# Patient Record
Sex: Male | Born: 1940 | Race: White | Hispanic: No | Marital: Married | Smoking: Never smoker
Health system: Southern US, Community
[De-identification: ages and names within clinical notes are randomized; demographics above are authoritative.]

## PROBLEM LIST (undated history)

## (undated) DIAGNOSIS — E785 Hyperlipidemia, unspecified: Secondary | ICD-10-CM

## (undated) DIAGNOSIS — R011 Cardiac murmur, unspecified: Secondary | ICD-10-CM

## (undated) DIAGNOSIS — K219 Gastro-esophageal reflux disease without esophagitis: Secondary | ICD-10-CM

## (undated) DIAGNOSIS — R768 Other specified abnormal immunological findings in serum: Secondary | ICD-10-CM

## (undated) DIAGNOSIS — I10 Essential (primary) hypertension: Secondary | ICD-10-CM

## (undated) HISTORY — PX: WISDOM TOOTH EXTRACTION: SHX21

## (undated) HISTORY — DX: Hyperlipidemia, unspecified: E78.5

## (undated) HISTORY — DX: Essential (primary) hypertension: I10

---

## 1965-04-24 HISTORY — PX: APPENDECTOMY: SHX54

## 1999-03-25 HISTORY — PX: CORONARY ARTERY BYPASS GRAFT: SHX141

## 2012-08-11 ENCOUNTER — Emergency Department: Payer: Self-pay | Admitting: Emergency Medicine

## 2012-08-11 LAB — URINALYSIS, COMPLETE
Bilirubin,UR: NEGATIVE
Glucose,UR: NEGATIVE mg/dL (ref 0–75)
Nitrite: NEGATIVE
Ph: 5 (ref 4.5–8.0)
Protein: NEGATIVE
RBC,UR: 7 /HPF (ref 0–5)
Specific Gravity: 1.01 (ref 1.003–1.030)
WBC UR: 7 /HPF (ref 0–5)

## 2012-08-11 LAB — COMPREHENSIVE METABOLIC PANEL
Albumin: 3.4 g/dL (ref 3.4–5.0)
Alkaline Phosphatase: 81 U/L (ref 50–136)
BUN: 27 mg/dL — ABNORMAL HIGH (ref 7–18)
Calcium, Total: 8.9 mg/dL (ref 8.5–10.1)
Chloride: 104 mmol/L (ref 98–107)
Creatinine: 1.41 mg/dL — ABNORMAL HIGH (ref 0.60–1.30)
EGFR (African American): 58 — ABNORMAL LOW
Glucose: 89 mg/dL (ref 65–99)
Osmolality: 282 (ref 275–301)
SGOT(AST): 26 U/L (ref 15–37)
Sodium: 139 mmol/L (ref 136–145)
Total Protein: 8.5 g/dL — ABNORMAL HIGH (ref 6.4–8.2)

## 2012-08-11 LAB — CBC
HCT: 36.7 % — ABNORMAL LOW (ref 40.0–52.0)
MCH: 29 pg (ref 26.0–34.0)
MCHC: 33 g/dL (ref 32.0–36.0)
Platelet: 320 10*3/uL (ref 150–440)
RBC: 4.18 10*6/uL — ABNORMAL LOW (ref 4.40–5.90)

## 2012-08-16 ENCOUNTER — Ambulatory Visit: Payer: Self-pay | Admitting: Urology

## 2012-08-21 ENCOUNTER — Ambulatory Visit: Payer: Self-pay

## 2012-08-21 LAB — CBC WITH DIFFERENTIAL/PLATELET
Eosinophil #: 0.2 10*3/uL (ref 0.0–0.7)
Eosinophil %: 1.8 %
HCT: 31.5 % — ABNORMAL LOW (ref 40.0–52.0)
HGB: 10 g/dL — ABNORMAL LOW (ref 13.0–18.0)
Lymphocyte #: 1.5 10*3/uL (ref 1.0–3.6)
MCH: 27.3 pg (ref 26.0–34.0)
MCHC: 31.8 g/dL — ABNORMAL LOW (ref 32.0–36.0)
MCV: 86 fL (ref 80–100)
Neutrophil #: 9.4 10*3/uL — ABNORMAL HIGH (ref 1.4–6.5)
Platelet: 429 10*3/uL (ref 150–440)
WBC: 12.1 10*3/uL — ABNORMAL HIGH (ref 3.8–10.6)

## 2012-08-21 LAB — COMPREHENSIVE METABOLIC PANEL
Albumin: 2.8 g/dL — ABNORMAL LOW (ref 3.4–5.0)
Alkaline Phosphatase: 280 U/L — ABNORMAL HIGH (ref 50–136)
Anion Gap: 8 (ref 7–16)
BUN: 59 mg/dL — ABNORMAL HIGH (ref 7–18)
Bilirubin,Total: 0.6 mg/dL (ref 0.2–1.0)
Calcium, Total: 9 mg/dL (ref 8.5–10.1)
Chloride: 102 mmol/L (ref 98–107)
Creatinine: 1.36 mg/dL — ABNORMAL HIGH (ref 0.60–1.30)
EGFR (Non-African Amer.): 52 — ABNORMAL LOW
Glucose: 100 mg/dL — ABNORMAL HIGH (ref 65–99)
Sodium: 142 mmol/L (ref 136–145)

## 2012-08-21 LAB — LIPID PANEL
Ldl Cholesterol, Calc: 43 mg/dL (ref 0–100)
VLDL Cholesterol, Calc: 19 mg/dL (ref 5–40)

## 2017-04-09 ENCOUNTER — Encounter: Payer: Self-pay | Admitting: Family Medicine

## 2017-04-09 ENCOUNTER — Ambulatory Visit: Payer: Self-pay | Admitting: Family Medicine

## 2017-04-09 ENCOUNTER — Ambulatory Visit (INDEPENDENT_AMBULATORY_CARE_PROVIDER_SITE_OTHER): Payer: Medicare Other | Admitting: Family Medicine

## 2017-04-09 ENCOUNTER — Other Ambulatory Visit
Admission: RE | Admit: 2017-04-09 | Discharge: 2017-04-09 | Disposition: A | Discharge status: Home / Self Care | Payer: Medicare Other | Source: Ambulatory Visit | Attending: Family Medicine | Admitting: Family Medicine

## 2017-04-09 VITALS — BP 128/62 | HR 70 | Ht 67.0 in | Wt 127.0 lb

## 2017-04-09 DIAGNOSIS — Z87442 Personal history of urinary calculi: Secondary | ICD-10-CM

## 2017-04-09 DIAGNOSIS — Z951 Presence of aortocoronary bypass graft: Secondary | ICD-10-CM | POA: Diagnosis not present

## 2017-04-09 DIAGNOSIS — I10 Essential (primary) hypertension: Secondary | ICD-10-CM | POA: Diagnosis present

## 2017-04-09 DIAGNOSIS — I2510 Atherosclerotic heart disease of native coronary artery without angina pectoris: Secondary | ICD-10-CM | POA: Diagnosis present

## 2017-04-09 DIAGNOSIS — D649 Anemia, unspecified: Secondary | ICD-10-CM | POA: Diagnosis not present

## 2017-04-09 DIAGNOSIS — Z23 Encounter for immunization: Secondary | ICD-10-CM

## 2017-04-09 DIAGNOSIS — R972 Elevated prostate specific antigen [PSA]: Secondary | ICD-10-CM | POA: Insufficient documentation

## 2017-04-09 DIAGNOSIS — Z8619 Personal history of other infectious and parasitic diseases: Secondary | ICD-10-CM | POA: Insufficient documentation

## 2017-04-09 DIAGNOSIS — E785 Hyperlipidemia, unspecified: Secondary | ICD-10-CM | POA: Diagnosis not present

## 2017-04-09 LAB — COMPREHENSIVE METABOLIC PANEL
ALBUMIN: 4.1 g/dL (ref 3.5–5.0)
ALK PHOS: 73 U/L (ref 38–126)
ALT: 19 U/L (ref 17–63)
AST: 28 U/L (ref 15–41)
Anion gap: 8 (ref 5–15)
BILIRUBIN TOTAL: 1.5 mg/dL — AB (ref 0.3–1.2)
BUN: 30 mg/dL — AB (ref 6–20)
CALCIUM: 8.8 mg/dL — AB (ref 8.9–10.3)
CO2: 30 mmol/L (ref 22–32)
CREATININE: 1.06 mg/dL (ref 0.61–1.24)
Chloride: 104 mmol/L (ref 101–111)
GFR calc Af Amer: >60 mL/min (ref >60)
GFR calc non Af Amer: >60 mL/min (ref >60)
GLUCOSE: 92 mg/dL (ref 65–99)
Potassium: 4.3 mmol/L (ref 3.5–5.1)
Sodium: 142 mmol/L (ref 135–145)
TOTAL PROTEIN: 7.9 g/dL (ref 6.5–8.1)

## 2017-04-09 LAB — CBC
HCT: 37.5 % — ABNORMAL LOW (ref 40.0–52.0)
Hemoglobin: 12.4 g/dL — ABNORMAL LOW (ref 13.0–18.0)
MCH: 29.3 pg (ref 26.0–34.0)
MCHC: 33.1 g/dL (ref 32.0–36.0)
MCV: 88.8 fL (ref 80.0–100.0)
PLATELETS: 217 10*3/uL (ref 150–440)
RBC: 4.22 MIL/uL — AB (ref 4.40–5.90)
RDW: 14.8 % — ABNORMAL HIGH (ref 11.5–14.5)
WBC: 8.1 10*3/uL (ref 3.8–10.6)

## 2017-04-09 LAB — LIPID PANEL
CHOL/HDL RATIO: 2.1 ratio
CHOLESTEROL: 121 mg/dL (ref 0–200)
HDL: 57 mg/dL (ref >40)
LDL Cholesterol: 55 mg/dL (ref 0–99)
Triglycerides: 45 mg/dL (ref <150)
VLDL: 9 mg/dL (ref 0–40)

## 2017-04-09 LAB — TSH: TSH: 1.407 u[IU]/mL (ref 0.350–4.500)

## 2017-04-09 MED ORDER — ZOSTER VAC RECOMB ADJUVANTED 50 MCG/0.5ML IM SUSR: 0.5000 mL | Freq: Once | INTRAMUSCULAR | 1 refills | Status: AC

## 2017-04-10 LAB — HCV RNA QUANT: HCV QUANT: NOT DETECTED [IU]/mL (ref >50)

## 2017-04-11 DIAGNOSIS — Z951 Presence of aortocoronary bypass graft: Secondary | ICD-10-CM | POA: Insufficient documentation

## 2017-04-11 NOTE — Progress Notes (Signed)
Date:  04/09/2017   Name:  Seth Woodard   DOB:  May 13, 1940   MRN:  [id number]  PCP:  Adline Potter, MD    Chief Complaint: Establish Care (Requesting EKG and CBC with PSA)   History of Present Illness:  This is a 76 y.o. male seen for initial visit. CAD s/p CABG x 4 in 2002, no further cardiac issues, has not seen cards in years. HTN on Ziac, HLD on Lipitor x yrs, not taking asa. Kidney stones 3-4 yrs ago, none since. Sl anemia on last blood work. PSA elevated to 8 in past, 4.1 when last checked in 2017. Told had positive hep C ab when tried to give blood. Plans to see optho soon for cataract. Father died hep C/heart dz 40, mother deid 70 Parkinsonism. Tdap 6 months ago, no pneumo or zoster imms, declines flu imm, colonoscopy 6-7 yrs ago normal  Review of Systems:  Review of Systems  Constitutional: Negative for chills and fever.  HENT: Negative for ear pain, sinus pain and trouble swallowing.   Eyes: Negative for pain.  Respiratory: Negative for cough and shortness of breath.   Cardiovascular: Negative for chest pain and leg swelling.  Gastrointestinal: Negative for abdominal pain.  Genitourinary: Negative for difficulty urinating, frequency and hematuria.  Neurological: Negative for syncope and light-headedness.    Patient Active Problem List   Diagnosis Date Noted  . History of four vessel coronary artery bypass graft 04/11/2017  . CAD (coronary artery disease) 04/09/2017  . Hypertension 04/09/2017  . Elevated PSA 04/09/2017  . 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 Hyperlipidemia 04/09/2017  . History of kidney stones 04/09/2017  . Anemia, normocytic normochromic 04/09/2017    Prior to Admission medications   Medication Sig Start Date End Date Taking? Authorizing Provider  aspirin EC 81 MG tablet Take 81 mg by mouth daily.   Yes [provider]  atorvastatin (LIPITOR) 80 MG tablet Take 80 mg by mouth daily.   Yes [provider]   bisoprolol-hydrochlorothiazide (ZIAC) 2.5-6.25 MG tablet Take 1 tablet by mouth daily.   Yes [provider]    No Known Allergies  Past Surgical History:  Procedure Laterality Date  . CORONARY ARTERY BYPASS GRAFT  03/1999   4 times    Social History   Tobacco Use  . Smoking status: Never Smoker  . Smokeless tobacco: Never Used  Substance Use Topics  . Alcohol use: No    Frequency: Never  . Drug use: No    History reviewed. No pertinent family history.  Medication list has been reviewed and updated.  Physical Examination: BP 128/62   Pulse 70   Ht 5\' 7"  (1.702 m)   Wt 127 lb (57.6 kg)   SpO2 98%   BMI 19.89 kg/m   Physical Exam  Constitutional: He is oriented to person, place, and time. He appears well-developed and well-nourished.  HENT:  Head: Normocephalic and atraumatic.  Right Ear: External ear normal.  Left Ear: External ear normal.  Nose: Nose normal.  Mouth/Throat: Oropharynx is clear and moist.  TMs clear  Eyes: Conjunctivae and EOM are normal. Pupils are equal, round, and reactive to light.  Neck: Neck supple. No thyromegaly present.  Cardiovascular: Normal rate, regular rhythm and normal heart sounds.  Pulmonary/Chest: Effort normal and breath sounds normal.  Abdominal: Soft. He exhibits no distension and no mass. There is no tenderness.  Musculoskeletal: He exhibits no edema.  Lymphadenopathy:    He has no cervical  adenopathy.  Neurological: He is alert and oriented to person, place, and time. Coordination normal.  Romberg neg, gait normal  Skin: Skin is warm and dry.  Psychiatric: He has a normal mood and affect. His behavior is normal.  Nursing note and vitals reviewed.   Assessment and Plan:  1. Coronary artery disease involving native coronary artery of native heart without angina pectoris Stable on BB/statin, add daily asa, likely needs stress test - Ambulatory referral to Cardiology - Comprehensive Metabolic Panel (CMET) -  CBC - Lipid Profile  2. Essential hypertension Well controlled on Ziac - TSH  3. Hyperlipidemia, unspecified hyperlipidemia type Well controlled on Lipitor  4. Anemia, normocytic normochromic Unclear etiology, recheck  5. Elevated PSA Consider repeat next visit  6. History of positive hepatitis C - HCV RNA quant  7. History of kidney stones No recurrence  8. History of four vessel coronary artery bypass graft  9. Need for zoster vaccination - Zoster Vaccine Adjuvanted [facility]) injection; Inject 0.5 mLs into the muscle once for 1 dose.  Dispense: 0.5 mL; Refill: 1  10. Need for pneumococcal vaccination - Pneumococcal conjugate vaccine 13-valent  Return in about 4 weeks (around 05/07/2017).   60 minutes spent with patient over half in [place]. [facility]  04/11/2017

## 2017-04-18 ENCOUNTER — Other Ambulatory Visit: Payer: Self-pay | Admitting: Family Medicine

## 2017-04-18 MED ORDER — ATORVASTATIN CALCIUM 40 MG PO TABS: 40.0000 mg | ORAL_TABLET | Freq: Every day | ORAL | 3 refills | Status: DC

## 2017-04-18 MED ORDER — BISOPROLOL-HYDROCHLOROTHIAZIDE 2.5-6.25 MG PO TABS: 1.0000 | ORAL_TABLET | Freq: Every day | ORAL | 3 refills | Status: DC

## 2017-04-18 MED ORDER — ATORVASTATIN CALCIUM 40 MG PO TABS: 20.0000 mg | ORAL_TABLET | Freq: Every day | ORAL | 3 refills | Status: DC

## 2017-04-21 ENCOUNTER — Ambulatory Visit
Admission: EM | Admit: 2017-04-21 | Discharge: 2017-04-21 | Disposition: A | Discharge status: Home / Self Care | Payer: Medicare Other | Attending: Family Medicine | Admitting: Family Medicine

## 2017-04-21 ENCOUNTER — Encounter: Payer: Self-pay | Admitting: *Deleted

## 2017-04-21 ENCOUNTER — Other Ambulatory Visit: Payer: Self-pay

## 2017-04-21 DIAGNOSIS — I10 Essential (primary) hypertension: Secondary | ICD-10-CM | POA: Insufficient documentation

## 2017-04-21 DIAGNOSIS — D649 Anemia, unspecified: Secondary | ICD-10-CM | POA: Insufficient documentation

## 2017-04-21 DIAGNOSIS — Z7982 Long term (current) use of aspirin: Secondary | ICD-10-CM | POA: Diagnosis not present

## 2017-04-21 DIAGNOSIS — B1920 Unspecified viral hepatitis C without hepatic coma: Secondary | ICD-10-CM | POA: Insufficient documentation

## 2017-04-21 DIAGNOSIS — Z79899 Other long term (current) drug therapy: Secondary | ICD-10-CM | POA: Insufficient documentation

## 2017-04-21 DIAGNOSIS — Z951 Presence of aortocoronary bypass graft: Secondary | ICD-10-CM | POA: Insufficient documentation

## 2017-04-21 DIAGNOSIS — E785 Hyperlipidemia, unspecified: Secondary | ICD-10-CM | POA: Diagnosis not present

## 2017-04-21 DIAGNOSIS — I2510 Atherosclerotic heart disease of native coronary artery without angina pectoris: Secondary | ICD-10-CM | POA: Diagnosis not present

## 2017-04-21 DIAGNOSIS — Z87442 Personal history of urinary calculi: Secondary | ICD-10-CM | POA: Insufficient documentation

## 2017-04-21 DIAGNOSIS — R319 Hematuria, unspecified: Secondary | ICD-10-CM | POA: Diagnosis not present

## 2017-04-21 LAB — URINALYSIS, COMPLETE (UACMP) WITH MICROSCOPIC: SQUAMOUS EPITHELIAL / LPF: NONE SEEN

## 2017-04-21 MED ORDER — SULFAMETHOXAZOLE-TRIMETHOPRIM 800-160 MG PO TABS: 1.0000 | ORAL_TABLET | Freq: Two times a day (BID) | ORAL | 0 refills | Status: DC

## 2017-04-21 NOTE — ED Provider Notes (Signed)
[facility]    CSN: [id number] Arrival date & time: 04/21/17  1125     History   Chief Complaint Chief Complaint  Patient presents with  . Hematuria    HPI Seth Woodard is a 76 y.o. male.   76 yo male with a c/o blood in the urine since yesterday. Denies any other symptoms. Denies pain, fevers, chills, vomiting, injuries, discharge.    The history is provided by the patient.    Past Medical History:  Diagnosis Date  . Hyperlipidemia   . Hypertension     Patient Active Problem List   Diagnosis Date Noted  . History of four vessel coronary artery bypass graft 04/11/2017  . CAD (coronary artery disease) 04/09/2017  . Hypertension 04/09/2017  . Elevated PSA 04/09/2017  . 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 Hyperlipidemia 04/09/2017  . History of kidney stones 04/09/2017  . Anemia, normocytic normochromic 04/09/2017    Past Surgical History:  Procedure Laterality Date  . CORONARY ARTERY BYPASS GRAFT  03/1999   4 times       Home Medications    Prior to Admission medications   Medication Sig Start Date End Date Taking? Authorizing Provider  atorvastatin (LIPITOR) 40 MG tablet Take 0.5 tablets (20 mg total) by mouth daily. 04/18/17  Yes Plonk, Gwyndolyn Saxon, MD  bisoprolol-hydrochlorothiazide [facility]) 2.5-6.25 MG tablet Take 1 tablet by mouth daily. 04/18/17  Yes Plonk, Gwyndolyn Saxon, MD  aspirin EC 81 MG tablet Take 81 mg by mouth daily.    [provider]  sulfamethoxazole-trimethoprim (BACTRIM DS,SEPTRA DS) 800-160 MG tablet Take 1 tablet by mouth 2 (two) times daily. 04/21/17   Norval Gable, MD    Family History History reviewed. No pertinent family history.  Social History Social History   Tobacco Use  . Smoking status: Never Smoker  . Smokeless tobacco: Never Used  Substance Use Topics  . Alcohol use: No    Frequency: Never  . Drug use: No     Allergies   Patient has no known allergies.   Review of Systems Review  of Systems   Physical Exam Triage Vital Signs ED Triage Vitals  Enc Vitals Group     BP 04/21/17 1403 (!) 152/66     Pulse Rate 04/21/17 1403 69     Resp 04/21/17 1403 16     Temp 04/21/17 1403 98.3 F (36.8 C)     Temp Source 04/21/17 1403 Oral     SpO2 04/21/17 1403 99 %     Weight --      Height --      Head Circumference --      Peak Flow --      Pain Score 04/21/17 1406 0     Pain Loc --      Pain Edu? --      Excl. in [place]? --    No data found.  Updated Vital Signs BP (!) 152/66 (BP Location: Left Arm)   Pulse 69   Temp 98.3 F (36.8 C) (Oral)   Resp 16   SpO2 99%   Visual Acuity Right Eye Distance:   Left Eye Distance:   Bilateral Distance:    Right Eye Near:   Left Eye Near:    Bilateral Near:     Physical Exam  Constitutional: He is oriented to person, place, and time. He appears well-developed and well-nourished. No distress.  Cardiovascular: Normal rate.  Pulmonary/Chest: Effort normal. No respiratory distress.  Abdominal: Soft. Bowel  sounds are normal. He exhibits no distension and no mass. There is no tenderness. There is no rebound and no guarding.  Neurological: He is alert and oriented to person, place, and time.  Skin: No rash noted. He is not diaphoretic.  Nursing note and vitals reviewed.    UC Treatments / Results  Labs (all labs ordered are listed, but only abnormal results are displayed) Labs Reviewed  URINALYSIS, COMPLETE (UACMP) WITH MICROSCOPIC - Abnormal; Notable for the following components:      Result Value   Color, Urine BROWN (*)    APPearance CLOUDY (*)    Glucose, UA   (*)    Value: TEST NOT REPORTED DUE TO COLOR INTERFERENCE OF URINE PIGMENT   Hgb urine dipstick   (*)    Value: TEST NOT REPORTED DUE TO COLOR INTERFERENCE OF URINE PIGMENT   Bilirubin Urine   (*)    Value: TEST NOT REPORTED DUE TO COLOR INTERFERENCE OF URINE PIGMENT   Ketones, ur   (*)    Value: TEST NOT REPORTED DUE TO COLOR INTERFERENCE OF URINE  PIGMENT   Protein, ur   (*)    Value: TEST NOT REPORTED DUE TO COLOR INTERFERENCE OF URINE PIGMENT   Nitrite   (*)    Value: TEST NOT REPORTED DUE TO COLOR INTERFERENCE OF URINE PIGMENT   Leukocytes, UA   (*)    Value: TEST NOT REPORTED DUE TO COLOR INTERFERENCE OF URINE PIGMENT   Bacteria, UA FEW (*)    All other components within normal limits  URINE CULTURE    EKG  EKG Interpretation None       Radiology No results found.  Procedures Procedures (including critical care time)  Medications Ordered in UC Medications - No data to display   Initial Impression / Assessment and Plan / UC Course  I have reviewed the triage vital signs and the nursing notes.  Pertinent labs & imaging results that were available during my care of the patient were reviewed by me and considered in my medical decision making (see chart for details).       Final Clinical Impressions(s) / UC Diagnoses   Final diagnoses:  Hematuria, unspecified type  (likely secondary to UTI)  ED Discharge Orders        Ordered    sulfamethoxazole-trimethoprim (BACTRIM DS,SEPTRA DS) 800-160 MG tablet  2 times daily     04/21/17 1441     1. Lab results (UA) and diagnosis reviewed with patient 2. rx as per orders above; reviewed possible side effects, interactions, risks and benefits  3. Recommend supportive treatment with increased fluids 4. Check urine culture 5. Follow-up prn if symptoms worsen or don't improve Controlled Substance Prescriptions [place] Controlled Substance Registry consulted? Not Applicable   Norval Gable, MD 04/22/17 1806

## 2017-04-21 NOTE — ED Triage Notes (Signed)
Patient started having symptom of blood in urine yesterday. Patient does have a history of prostate issues.

## 2017-04-23 LAB — URINE CULTURE: Special Requests: NORMAL

## 2017-05-07 ENCOUNTER — Encounter: Payer: Self-pay | Admitting: Family Medicine

## 2017-05-07 ENCOUNTER — Ambulatory Visit (INDEPENDENT_AMBULATORY_CARE_PROVIDER_SITE_OTHER): Payer: Medicare Other | Admitting: Family Medicine

## 2017-05-07 VITALS — BP 120/62 | HR 64 | Ht 67.0 in | Wt 128.0 lb

## 2017-05-07 DIAGNOSIS — R3121 Asymptomatic microscopic hematuria: Secondary | ICD-10-CM | POA: Diagnosis not present

## 2017-05-07 DIAGNOSIS — I2510 Atherosclerotic heart disease of native coronary artery without angina pectoris: Secondary | ICD-10-CM

## 2017-05-07 DIAGNOSIS — R972 Elevated prostate specific antigen [PSA]: Secondary | ICD-10-CM

## 2017-05-07 DIAGNOSIS — I10 Essential (primary) hypertension: Secondary | ICD-10-CM | POA: Diagnosis not present

## 2017-05-07 LAB — POCT URINALYSIS DIPSTICK
Bilirubin, UA: NEGATIVE
GLUCOSE UA: NEGATIVE
Ketones, UA: NEGATIVE
Nitrite, UA: NEGATIVE
SPEC GRAV UA: 1.025 (ref 1.010–1.025)
Urobilinogen, UA: 0.2 E.U./dL
pH, UA: 6 (ref 5.0–8.0)

## 2017-05-08 ENCOUNTER — Other Ambulatory Visit: Payer: Self-pay

## 2017-05-08 ENCOUNTER — Other Ambulatory Visit: Payer: Self-pay | Admitting: Family Medicine

## 2017-05-08 DIAGNOSIS — R3121 Asymptomatic microscopic hematuria: Secondary | ICD-10-CM

## 2017-05-08 LAB — PSA: Prostate Specific Ag, Serum: 4.6 ng/mL — ABNORMAL HIGH (ref 0.0–4.0)

## 2017-05-08 LAB — VITAMIN D 25 HYDROXY (VIT D DEFICIENCY, FRACTURES): VIT D 25 HYDROXY: 34.3 ng/mL (ref 30.0–100.0)

## 2017-05-08 NOTE — Progress Notes (Signed)
Date:  05/07/2017   Name:  Seth Woodard   DOB:  05-Dec-1940   MRN:  [id number]  PCP:  Adline Potter, MD    Chief Complaint: Follow-up (possible vitamin d level drawn? "Not sure why I'm here")   History of Present Illness:  This is a 77 y.o. male seen for one month f/u. Saw cards, scheduled for stress echo the 28th. Dx'd UTI 12/29 at [facility] [place] resolved with abx but urine cx equivocal. No current gross hematuria. Blood work last visit showed sl anemia which is chronic and hypocalcemia, on vit D and calcium supplement.  Review of Systems:  Review of Systems  Constitutional: Negative for chills and fever.  Respiratory: Negative for cough and shortness of breath.   Cardiovascular: Negative for chest pain and leg swelling.  Genitourinary: Negative for difficulty urinating and dysuria.  Neurological: Negative for syncope and light-headedness.    Patient Active Problem List   Diagnosis Date Noted  . History of four vessel coronary artery bypass graft 04/11/2017  . CAD (coronary artery disease) 04/09/2017  . Hypertension 04/09/2017  . Elevated PSA 04/09/2017  . 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 Hyperlipidemia 04/09/2017  . History of kidney stones 04/09/2017  . Anemia, normocytic normochromic 04/09/2017    Prior to Admission medications   Medication Sig Start Date End Date Taking? Authorizing Provider  atorvastatin (LIPITOR) 40 MG tablet Take 0.5 tablets (20 mg total) by mouth daily. 04/18/17  Yes Zinnia Tindall, Gwyndolyn Saxon, MD  bisoprolol-hydrochlorothiazide [facility]) 2.5-6.25 MG tablet Take 1 tablet by mouth daily. 04/18/17  Yes Alireza Pollack, Gwyndolyn Saxon, MD  Calcium Carbonate-Vitamin D (RA CALCIUM PLUS VITAMIN D) 600-400 MG-UNIT tablet Take 1 tablet by mouth daily.   Yes [provider]  vitamin C (ASCORBIC ACID) 500 MG tablet Take 500 mg by mouth daily.   Yes [provider]  aspirin EC 81 MG tablet Take 81 mg by mouth daily.    [provider]    No Known  Allergies  Past Surgical History:  Procedure Laterality Date  . CORONARY ARTERY BYPASS GRAFT  03/1999   4 times    Social History   Tobacco Use  . Smoking status: Never Smoker  . Smokeless tobacco: Never Used  Substance Use Topics  . Alcohol use: No    Frequency: Never  . Drug use: No    No family history on file.  Medication list has been reviewed and updated.  Physical Examination: BP 120/62   Pulse 64   Ht 5\' 7"  (1.702 m)   Wt 128 lb (58.1 kg)   BMI 20.05 kg/m   Physical Exam  Constitutional: He appears well-developed and well-nourished.  Cardiovascular: Normal rate, regular rhythm and normal heart sounds.  Pulmonary/Chest: Effort normal and breath sounds normal.  Musculoskeletal: He exhibits no edema.  Neurological: He is alert.  Skin: Skin is warm and dry.  Psychiatric: He has a normal mood and affect. His behavior is normal.  Nursing note and vitals reviewed.   Assessment and Plan:  1. Coronary artery disease involving native coronary artery of native heart without angina pectoris Stable on BB/asa, for stress echo end of month  2. Essential hypertension Well controlled on Ziac  3. Hypocalcemia On vit D/calcium supplement - Vitamin D (25 hydroxy)  4. Elevated PSA - PSA  5. Asymptomatic microscopic hematuria UA shows 3+ leuk, lg blood, insufficient volume for cx, will return in AM to give sample, consider urology referral - POCT Urinalysis Dipstick  Return in about  6 months (around 11/04/2017).  Satira Anis. [facility]  05/08/2017

## 2017-05-10 LAB — URINE CULTURE: ORGANISM ID, BACTERIA: NO GROWTH

## 2017-05-10 LAB — SPECIMEN STATUS REPORT

## 2017-05-14 ENCOUNTER — Other Ambulatory Visit: Payer: Self-pay | Admitting: Family Medicine

## 2017-05-14 DIAGNOSIS — R3121 Asymptomatic microscopic hematuria: Secondary | ICD-10-CM

## 2017-06-12 ENCOUNTER — Other Ambulatory Visit: Payer: Self-pay

## 2017-06-12 ENCOUNTER — Encounter: Payer: Self-pay | Admitting: *Deleted

## 2017-06-15 NOTE — Discharge Instructions (Signed)

## 2017-06-18 ENCOUNTER — Ambulatory Visit: Payer: Medicare Other | Admitting: Anesthesiology

## 2017-06-18 ENCOUNTER — Ambulatory Visit
Admission: RE | Admit: 2017-06-18 | Discharge: 2017-06-18 | Disposition: A | Discharge status: Home / Self Care | Payer: Medicare Other | Source: Ambulatory Visit | Attending: Ophthalmology | Admitting: Ophthalmology

## 2017-06-18 ENCOUNTER — Encounter
Admission: RE | Disposition: A | Discharge status: Home / Self Care | Payer: Self-pay | Source: Ambulatory Visit | Attending: Ophthalmology

## 2017-06-18 DIAGNOSIS — Z955 Presence of coronary angioplasty implant and graft: Secondary | ICD-10-CM | POA: Diagnosis not present

## 2017-06-18 DIAGNOSIS — E7800 Pure hypercholesterolemia, unspecified: Secondary | ICD-10-CM | POA: Diagnosis not present

## 2017-06-18 DIAGNOSIS — H2511 Age-related nuclear cataract, right eye: Secondary | ICD-10-CM | POA: Insufficient documentation

## 2017-06-18 DIAGNOSIS — I10 Essential (primary) hypertension: Secondary | ICD-10-CM | POA: Insufficient documentation

## 2017-06-18 DIAGNOSIS — Z951 Presence of aortocoronary bypass graft: Secondary | ICD-10-CM | POA: Insufficient documentation

## 2017-06-18 DIAGNOSIS — I2510 Atherosclerotic heart disease of native coronary artery without angina pectoris: Secondary | ICD-10-CM | POA: Insufficient documentation

## 2017-06-18 HISTORY — DX: Other specified abnormal immunological findings in serum: R76.8

## 2017-06-18 HISTORY — DX: Cardiac murmur, unspecified: R01.1

## 2017-06-18 HISTORY — PX: CATARACT EXTRACTION W/PHACO: SHX586

## 2017-06-18 SURGERY — PHACOEMULSIFICATION, CATARACT, WITH IOL INSERTION
Anesthesia: Monitor Anesthesia Care | Site: Eye | Laterality: Right | Wound class: Clean

## 2017-06-18 MED ORDER — LIDOCAINE HCL (PF) 2 % IJ SOLN
INTRAOCULAR | Status: DC | PRN
  Administered 2017-06-18: 1 mL

## 2017-06-18 MED ORDER — EPINEPHRINE PF 1 MG/ML IJ SOLN
INTRAOCULAR | Status: DC | PRN
  Administered 2017-06-18: 75 mL via OPHTHALMIC

## 2017-06-18 MED ORDER — BRIMONIDINE TARTRATE-TIMOLOL 0.2-0.5 % OP SOLN
OPHTHALMIC | Status: DC | PRN
  Administered 2017-06-18: 1 [drp] via OPHTHALMIC

## 2017-06-18 MED ORDER — ARMC OPHTHALMIC DILATING DROPS
1.0000 "application " | OPHTHALMIC | Status: DC | PRN
  Administered 2017-06-18 (×3): 1 via OPHTHALMIC

## 2017-06-18 MED ORDER — MIDAZOLAM HCL 2 MG/2ML IJ SOLN
INTRAMUSCULAR | Status: DC | PRN
  Administered 2017-06-18 (×2): 1 mg via INTRAVENOUS

## 2017-06-18 MED ORDER — CEFUROXIME OPHTHALMIC INJECTION 1 MG/0.1 ML
INJECTION | OPHTHALMIC | Status: DC | PRN
  Administered 2017-06-18: 0.1 mL via INTRACAMERAL

## 2017-06-18 MED ORDER — FENTANYL CITRATE (PF) 100 MCG/2ML IJ SOLN
INTRAMUSCULAR | Status: DC | PRN
  Administered 2017-06-18: 50 ug via INTRAVENOUS

## 2017-06-18 MED ORDER — MOXIFLOXACIN HCL 0.5 % OP SOLN
1.0000 [drp] | OPHTHALMIC | Status: DC | PRN
  Administered 2017-06-18 (×3): 1 [drp] via OPHTHALMIC

## 2017-06-18 MED ORDER — NA HYALUR & NA CHOND-NA HYALUR 0.4-0.35 ML IO KIT
PACK | INTRAOCULAR | Status: DC | PRN
  Administered 2017-06-18: 1 mL via INTRAOCULAR

## 2017-06-18 SURGICAL SUPPLY — 18 items
CANNULA ANT/CHMB 27GA (MISCELLANEOUS) ×2 IMPLANT
GLOVE SURG LX 7.5 STRW (GLOVE) ×1
GLOVE SURG LX STRL 7.5 STRW (GLOVE) ×1 IMPLANT
GLOVE SURG TRIUMPH 8.0 PF LTX (GLOVE) ×2 IMPLANT
GOWN STRL REUS W/ TWL LRG LVL3 (GOWN DISPOSABLE) ×2 IMPLANT
GOWN STRL REUS W/TWL LRG LVL3 (GOWN DISPOSABLE) ×2
LENS IOL TECNIS ITEC 20.5 (Intraocular Lens) ×2 IMPLANT
MARKER SKIN DUAL TIP RULER LAB (MISCELLANEOUS) ×2 IMPLANT
NEEDLE FILTER BLUNT 18X 1/2SAF (NEEDLE) ×1
NEEDLE FILTER BLUNT 18X1 1/2 (NEEDLE) ×1 IMPLANT
PACK CATARACT BRASINGTON (MISCELLANEOUS) ×2 IMPLANT
PACK EYE AFTER SURG (MISCELLANEOUS) ×2 IMPLANT
PACK OPTHALMIC (MISCELLANEOUS) ×2 IMPLANT
SYR 3ML LL SCALE MARK (SYRINGE) ×2 IMPLANT
SYR 5ML LL (SYRINGE) ×2 IMPLANT
SYR TB 1ML LUER SLIP (SYRINGE) ×2 IMPLANT
WATER STERILE IRR 500ML POUR (IV SOLUTION) ×2 IMPLANT
WIPE NON LINTING 3.25X3.25 (MISCELLANEOUS) ×2 IMPLANT

## 2017-06-18 NOTE — Anesthesia Procedure Notes (Signed)
Procedure Name: MAC Date/Time: 06/18/2017 11:49 AM Performed by: Cameron Ali, CRNA Pre-anesthesia Checklist: Patient identified, Emergency Drugs available, Suction available, Timeout performed and Patient being monitored Patient Re-evaluated:Patient Re-evaluated prior to induction Oxygen Delivery Method: Nasal cannula Placement Confirmation: positive ETCO2

## 2017-06-18 NOTE — Transfer of Care (Signed)
Immediate Anesthesia Transfer of Care Note  Patient: Seth Woodard  Procedure(s) Performed: CATARACT EXTRACTION PHACO AND INTRAOCULAR LENS PLACEMENT (IOC) RIGHT (Right Eye)  Patient Location: PACU  Anesthesia Type: MAC  Level of Consciousness: awake, alert  and patient cooperative  Airway and Oxygen Therapy: Patient Spontanous Breathing and Patient connected to supplemental oxygen  Post-op Assessment: Post-op Vital signs reviewed, Patient's Cardiovascular Status Stable, Respiratory Function Stable, Patent Airway and No signs of Nausea or vomiting  Post-op Vital Signs: Reviewed and stable  Complications: No apparent anesthesia complications

## 2017-06-18 NOTE — Anesthesia Preprocedure Evaluation (Signed)
Anesthesia Evaluation  Patient identified by MRN, date of birth, ID band Patient awake    Reviewed: Allergy & Precautions, H&P , NPO status , Patient's Chart, lab work & pertinent test results, reviewed documented beta blocker date and time   Airway Mallampati: II  TM Distance: >3 FB Neck ROM: full    Dental no notable dental hx.    Pulmonary neg pulmonary ROS,    Pulmonary exam normal breath sounds clear to auscultation       Cardiovascular Exercise Tolerance: Good hypertension, + CAD and + CABG   Rhythm:regular Rate:Normal     Neuro/Psych negative neurological ROS  negative psych ROS   GI/Hepatic negative GI ROS, (+) Hepatitis -, C  Endo/Other  negative endocrine ROS  Renal/GU negative Renal ROS  negative genitourinary   Musculoskeletal   Abdominal   Peds  Hematology negative hematology ROS (+)   Anesthesia Other Findings   Reproductive/Obstetrics negative OB ROS                             Anesthesia Physical Anesthesia Plan  ASA: III  Anesthesia Plan: MAC   Post-op Pain Management:    Induction:   PONV Risk Score and Plan:   Airway Management Planned:   Additional Equipment:   Intra-op Plan:   Post-operative Plan:   Informed Consent: I have reviewed the patients History and Physical, chart, labs and discussed the procedure including the risks, benefits and alternatives for the proposed anesthesia with the patient or authorized representative who has indicated his/her understanding and acceptance.   Dental Advisory Given  Plan Discussed with: CRNA  Anesthesia Plan Comments:         Anesthesia Quick Evaluation

## 2017-06-18 NOTE — Anesthesia Postprocedure Evaluation (Signed)
Anesthesia Post Note  Patient: Seth Woodard  Procedure(s) Performed: CATARACT EXTRACTION PHACO AND INTRAOCULAR LENS PLACEMENT (IOC) RIGHT (Right Eye)  Patient location during evaluation: PACU Anesthesia Type: MAC Level of consciousness: awake and alert Pain management: pain level controlled Vital Signs Assessment: post-procedure vital signs reviewed and stable Respiratory status: spontaneous breathing, nonlabored ventilation, respiratory function stable and patient connected to nasal cannula oxygen Cardiovascular status: stable and blood pressure returned to baseline Postop Assessment: no apparent nausea or vomiting Anesthetic complications: no    Alisa Graff

## 2017-06-18 NOTE — Op Note (Signed)
LOCATION:  [facility]   PREOPERATIVE DIAGNOSIS:    Nuclear sclerotic cataract right eye. H25.11   POSTOPERATIVE DIAGNOSIS:  Nuclear sclerotic cataract right eye.     PROCEDURE:  Phacoemusification with posterior chamber intraocular lens placement of the right eye   LENS:   Implant Name Type Inv. Item Serial No. Manufacturer Lot No. LRB No. Used  LENS IOL DIOP 20.5 - [id number] Intraocular Lens LENS IOL DIOP 20.5 [id number]  Right 1        ULTRASOUND TIME: 21 % of 1 minutes, 28 seconds.  CDE 18.1   SURGEON:  Wyonia Hough, MD   ANESTHESIA:  Topical with tetracaine drops and 2% Xylocaine jelly, augmented with 1% preservative-free intracameral lidocaine.    COMPLICATIONS:  None.   DESCRIPTION OF PROCEDURE:  The patient was identified in the holding room and transported to the operating room and placed in the supine position under the operating microscope.  The right eye was identified as the operative eye and it was prepped and draped in the usual sterile ophthalmic fashion.   A 1 millimeter clear-corneal paracentesis was made at the 12:00 position.  0.5 ml of preservative-free 1% lidocaine was injected into the anterior chamber. The anterior chamber was filled with Viscoat viscoelastic.  A 2.4 millimeter keratome was used to make a near-clear corneal incision at the 9:00 position.  A curvilinear capsulorrhexis was made with a cystotome and capsulorrhexis forceps.  Balanced salt solution was used to hydrodissect and hydrodelineate the nucleus.   Phacoemulsification was then used in stop and chop fashion to remove the lens nucleus and epinucleus.  The remaining cortex was then removed using the irrigation and aspiration handpiece. Provisc was then placed into the capsular bag to distend it for lens placement.  A lens was then injected into the capsular bag.  The remaining viscoelastic was aspirated.   Wounds were hydrated with balanced salt solution.  The anterior  chamber was inflated to a physiologic pressure with balanced salt solution.  No wound leaks were noted. Cefuroxime 0.1 ml of a 10mg /ml solution was injected into the anterior chamber for a dose of 1 mg of intracameral antibiotic at the completion of the case.   Timolol and Brimonidine drops were applied to the eye.  The patient was taken to the recovery room in stable condition without complications of anesthesia or surgery.   Burt Piatek 06/18/2017, 12:05 PM

## 2017-06-18 NOTE — H&P (Signed)
The History and Physical notes are on paper, have been signed, and are to be scanned. The patient remains stable and unchanged from the H&P.   Previous H&P reviewed, patient examined, and there are no changes.  Seth Woodard 06/18/2017 11:21 AM

## 2017-07-03 ENCOUNTER — Telehealth: Payer: Self-pay | Admitting: Family Medicine

## 2017-07-03 NOTE — Telephone Encounter (Signed)
Pt needs new referral to Urological per [facility] in [place].

## 2017-07-04 NOTE — Telephone Encounter (Signed)
Called Lisa at [facility] and she said no one there has asked to schedule needle biopsy and would not need referral they would need to do consult and they CAN use old referral as Alonza Smoker has told him. Lattie Haw will call him and go over steps for needle biopsy after finding out who advised a biopsy and why. She will message me back or call.

## 2017-07-20 ENCOUNTER — Ambulatory Visit (INDEPENDENT_AMBULATORY_CARE_PROVIDER_SITE_OTHER): Payer: Medicare Other | Admitting: Urology

## 2017-07-20 ENCOUNTER — Encounter: Payer: Self-pay | Admitting: Urology

## 2017-07-20 VITALS — BP 126/70 | HR 84 | Ht 67.0 in | Wt 120.0 lb

## 2017-07-20 DIAGNOSIS — R310 Gross hematuria: Secondary | ICD-10-CM

## 2017-07-20 DIAGNOSIS — I2510 Atherosclerotic heart disease of native coronary artery without angina pectoris: Secondary | ICD-10-CM

## 2017-07-20 DIAGNOSIS — N200 Calculus of kidney: Secondary | ICD-10-CM

## 2017-07-20 DIAGNOSIS — Z125 Encounter for screening for malignant neoplasm of prostate: Secondary | ICD-10-CM

## 2017-07-20 DIAGNOSIS — N400 Enlarged prostate without lower urinary tract symptoms: Secondary | ICD-10-CM

## 2017-07-20 LAB — URINALYSIS, COMPLETE
Bilirubin, UA: NEGATIVE
Glucose, UA: NEGATIVE
Ketones, UA: NEGATIVE
Nitrite, UA: NEGATIVE
PH UA: 6.5 (ref 5.0–7.5)
Specific Gravity, UA: 1.02 (ref 1.005–1.030)
Urobilinogen, Ur: 0.2 mg/dL (ref 0.2–1.0)

## 2017-07-20 LAB — BLADDER SCAN AMB NON-IMAGING

## 2017-07-20 LAB — MICROSCOPIC EXAMINATION
EPITHELIAL CELLS (NON RENAL): NONE SEEN /HPF (ref 0–10)
WBC, UA: >30 /hpf — ABNORMAL HIGH (ref 0–5)

## 2017-07-20 MED ORDER — TAMSULOSIN HCL 0.4 MG PO CAPS: 0.4000 mg | ORAL_CAPSULE | Freq: Every day | ORAL | 11 refills | Status: DC

## 2017-07-20 NOTE — Progress Notes (Signed)
07/20/2017 9:22 AM   Seth Woodard 01/24/1941 [id number]  Referring provider: Adline Potter, MD [address] [place], [place] [address]  Chief Complaint  Patient presents with  . Hematuria    New Patient    HPI: The patient is a 77 year old gentleman who presents today for evaluation of gross hematuria.  1.  Gross hematuria The patient has been experiencing intermittent gross hematuria since December 2018.  He has microscopic hematuria on urinalysis today.  He has had no pain with this.  He has never smoked.  He has not had gross hematuria previously.  His last UTI was in 2014 per report from the patient.  2.  BPH The patient has a high PVR today at 300 cc.  He has never taken medication for BPH before.  He does note multiple urinary symptoms though he blames most of his symptoms on his diuretic.  He has daytime frequency and nocturia.  He has urinary urgency and intermittent stream.  He also blames urgency on his diuretic.  It improves when the diuretic wears off.  He is not very concerned by his urinary symptoms.  3.  Prostate cancer screening Patient also is here to discuss his PSA was 4.6 in January 2019.  It was 8.7 in April 2014.  He had a negative prostate biopsy in 2014.  No family history of prostate cancer.  4.  History of nephrolithiasis The patient has passed stones spontaneously.  The last 04-2012.  No previous surgeries.  Composition unknown.   PMH: Past Medical History:  Diagnosis Date  . Heart murmur   . 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 Hyperlipidemia   . Hypertension     Surgical History: Past Surgical History:  Procedure Laterality Date  . APPENDECTOMY  1967  . CATARACT EXTRACTION W/PHACO Right 06/18/2017   Procedure: CATARACT EXTRACTION PHACO AND INTRAOCULAR LENS PLACEMENT ([place]) RIGHT;  Surgeon: Leandrew Koyanagi, MD;  Location: [facility];  Service: Ophthalmology;  Laterality: Right;  requests to be  last  . 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 WISDOM TOOTH EXTRACTION      Home Medications:  Allergies as of 07/20/2017   No Known Allergies     Medication List        Accurate as of 07/20/17  9:22 AM. Always use your most recent med list.          atorvastatin 40 MG tablet Commonly known as:  LIPITOR Take 0.5 tablets (20 mg total) by mouth daily.   bisoprolol-hydrochlorothiazide 2.5-6.25 MG tablet Commonly known as:  ZIAC Take 1 tablet by mouth daily.   RA CALCIUM PLUS VITAMIN D 600-400 MG-UNIT tablet Generic drug:  Calcium Carbonate-Vitamin D Take 1 tablet by mouth daily.   tamsulosin 0.4 MG Caps capsule Commonly known as:  FLOMAX Take 1 capsule (0.4 mg total) by mouth daily.   vitamin C 500 MG tablet Commonly known as:  ASCORBIC ACID Take 500 mg by mouth daily.       Allergies: No Known Allergies  Family History: No family history on file.  Social History:  reports that he has never smoked. He has never used smokeless tobacco. He reports that he does not drink alcohol or use drugs.  ROS: UROLOGY Frequent Urination?: Yes Hard to postpone urination?: Yes Burning/pain with urination?: Yes Get up at night to urinate?: Yes Leakage of urine?: Yes Urine stream starts and stops?: Yes Trouble  starting stream?: Yes Do you have to strain to urinate?: Yes Blood in urine?: Yes Urinary tract infection?: Yes Sexually transmitted disease?: No Injury to kidneys or bladder?: No Painful intercourse?: No Weak stream?: No Erection problems?: No Penile pain?: No  Gastrointestinal Nausea?: No Vomiting?: No Indigestion/heartburn?: No Diarrhea?: No Constipation?: No  Constitutional Fever: No Night sweats?: No Weight loss?: No Fatigue?: No  Skin Skin rash/lesions?: No Itching?: No  Eyes Blurred vision?: No Double vision?: No  Ears/Nose/Throat Sore throat?: No Sinus problems?: No  Hematologic/Lymphatic Swollen glands?: No Easy  bruising?: No  Cardiovascular Leg swelling?: No Chest pain?: No  Respiratory Cough?: No Shortness of breath?: No  Endocrine Excessive thirst?: No  Musculoskeletal Back pain?: No Joint pain?: No  Neurological Headaches?: No Dizziness?: No  Psychologic Depression?: No Anxiety?: No  Physical Exam: BP 126/70   Pulse 84   Ht 5\' 7"  (1.702 m)   Wt 120 lb (54.4 kg)   BMI 18.79 kg/m   Constitutional:  Alert and oriented, No acute distress. HEENT: [place] AT, moist mucus membranes.  Trachea midline, no masses. Cardiovascular: No clubbing, cyanosis, or edema. Respiratory: Normal respiratory effort, no increased work of breathing. GI: Abdomen is soft, nontender, nondistended, no abdominal masses GU: No CVA tenderness.  Normal phallus.  Testicles descended equally bilaterally.  Benign.  DRE: 2+ smooth benign. Skin: No rashes, bruises or suspicious lesions. Lymph: No cervical or inguinal adenopathy. Neurologic: Grossly intact, no focal deficits, moving all 4 extremities. Psychiatric: Normal mood and affect.  Laboratory Data: Lab Results  Component Value Date   WBC 8.1 04/09/2017   HGB 12.4 (L) 04/09/2017   HCT 37.5 (L) 04/09/2017   MCV 88.8 04/09/2017   PLT 217 04/09/2017    Lab Results  Component Value Date   CREATININE 1.06 04/09/2017    Lab Results  Component Value Date   PSA 8.7 (H) 08/21/2012    No results found for: TESTOSTERONE  No results found for: HGBA1C  Urinalysis    Component Value Date/Time   COLORURINE BROWN (A) 04/21/2017 1404   APPEARANCEUR CLOUDY (A) 04/21/2017 1404   APPEARANCEUR Clear 08/11/2012 1707   LABSPEC  04/21/2017 1404    TEST NOT REPORTED DUE TO COLOR INTERFERENCE OF URINE PIGMENT   LABSPEC 1.010 08/11/2012 1707   PHURINE  04/21/2017 1404    TEST NOT REPORTED DUE TO COLOR INTERFERENCE OF URINE PIGMENT   GLUCOSEU (A) 04/21/2017 1404    TEST NOT REPORTED DUE TO COLOR INTERFERENCE OF URINE PIGMENT   GLUCOSEU Negative 08/11/2012  1707   HGBUR (A) 04/21/2017 1404    TEST NOT REPORTED DUE TO COLOR INTERFERENCE OF URINE PIGMENT   BILIRUBINUR neg 05/07/2017 1708   BILIRUBINUR Negative 08/11/2012 1707   KETONESUR (A) 04/21/2017 1404    TEST NOT REPORTED DUE TO COLOR INTERFERENCE OF URINE PIGMENT   PROTEINUR 30+ 05/07/2017 1708   PROTEINUR (A) 04/21/2017 1404    TEST NOT REPORTED DUE TO COLOR INTERFERENCE OF URINE PIGMENT   UROBILINOGEN 0.2 05/07/2017 1708   NITRITE neg 05/07/2017 1708   NITRITE (A) 04/21/2017 1404    TEST NOT REPORTED DUE TO COLOR INTERFERENCE OF URINE PIGMENT   LEUKOCYTESUR Large (3+) (A) 05/07/2017 1708   LEUKOCYTESUR Trace 08/11/2012 1707    Assessment & Plan:    1.  Gross hematuria The patient will undergo gross hematuria workup with a CT urogram followed by office cystoscopy.  2.  BPH with incomplete bladder emptying I discussed with the patient I am concerned by  his PVR of 300 cc.  I also think some of his urinary symptoms are from more than just his diuretic.  We will start him on Flomax for this to see if this improves both.  3.  Prostate cancer screening I had an extended conversation the patient regarding the American urological Association guidelines that recommend against prostate cancer screening over the age of 22.  We also discussed that if we do check a PSA and gentleman in their mid 25s that the normal range has to be extended to at least 10.  His PSA falls within the normal range for his age.  His rectal exam is unremarkable.  At this point, given the American urological Association guidelines and age-appropriate PSA, I have recommended against further PSA screening in this patient.  4.  History of nephrolithiasis Above CT will further evaluate.  Return for after CT for cysto.  Nickie Retort, MD  [facility] [address], [place] at [place], [place] [address] [phone]

## 2017-07-23 ENCOUNTER — Other Ambulatory Visit: Payer: Self-pay | Admitting: Family Medicine

## 2017-07-23 LAB — CULTURE, URINE COMPREHENSIVE

## 2017-07-23 MED ORDER — ATORVASTATIN CALCIUM 40 MG PO TABS: 40.0000 mg | ORAL_TABLET | Freq: Every day | ORAL | 3 refills | Status: DC

## 2017-07-30 ENCOUNTER — Other Ambulatory Visit: Payer: Self-pay

## 2017-07-30 ENCOUNTER — Telehealth: Payer: Self-pay | Admitting: Urology

## 2017-07-30 DIAGNOSIS — R310 Gross hematuria: Secondary | ICD-10-CM

## 2017-07-30 NOTE — Telephone Encounter (Signed)
Patient is having a CT scan on 08-01-17 in [place] but has not had any labs in a while. Budzyn ordered it but I need an order for bun and creatinine. Do you mind putting an order in for me please and thank you.   Thanks, Sharyn Lull

## 2017-08-01 ENCOUNTER — Other Ambulatory Visit
Admission: RE | Admit: 2017-08-01 | Discharge: 2017-08-01 | Disposition: A | Discharge status: Home / Self Care | Payer: Medicare Other | Source: Ambulatory Visit | Attending: Urology | Admitting: Urology

## 2017-08-01 ENCOUNTER — Ambulatory Visit
Admission: RE | Admit: 2017-08-01 | Discharge: 2017-08-01 | Disposition: A | Discharge status: Home / Self Care | Payer: Medicare Other | Source: Ambulatory Visit | Attending: Urology | Admitting: Urology

## 2017-08-01 DIAGNOSIS — N200 Calculus of kidney: Secondary | ICD-10-CM | POA: Insufficient documentation

## 2017-08-01 DIAGNOSIS — N400 Enlarged prostate without lower urinary tract symptoms: Secondary | ICD-10-CM | POA: Diagnosis not present

## 2017-08-01 DIAGNOSIS — N1330 Unspecified hydronephrosis: Secondary | ICD-10-CM | POA: Insufficient documentation

## 2017-08-01 DIAGNOSIS — R310 Gross hematuria: Secondary | ICD-10-CM | POA: Diagnosis present

## 2017-08-01 DIAGNOSIS — J61 Pneumoconiosis due to asbestos and other mineral fibers: Secondary | ICD-10-CM | POA: Insufficient documentation

## 2017-08-01 DIAGNOSIS — N210 Calculus in bladder: Secondary | ICD-10-CM | POA: Insufficient documentation

## 2017-08-01 LAB — CREATININE, SERUM
Creatinine, Ser: 1.09 mg/dL (ref 0.61–1.24)
GFR calc Af Amer: >60 mL/min (ref >60)
GFR calc non Af Amer: >60 mL/min (ref >60)

## 2017-08-01 LAB — BUN: BUN: 30 mg/dL — ABNORMAL HIGH (ref 6–20)

## 2017-08-01 MED ORDER — IOPAMIDOL (ISOVUE-300) INJECTION 61%
150.0000 mL | Freq: Once | INTRAVENOUS | Status: AC | PRN
  Administered 2017-08-01: 125 mL via INTRAVENOUS

## 2017-08-06 ENCOUNTER — Encounter: Payer: Self-pay | Admitting: Urology

## 2017-08-10 ENCOUNTER — Encounter: Payer: Self-pay | Admitting: Urology

## 2017-08-11 ENCOUNTER — Encounter: Payer: Self-pay | Admitting: Urology

## 2017-08-13 ENCOUNTER — Encounter: Payer: Self-pay | Admitting: Urology

## 2017-08-13 ENCOUNTER — Ambulatory Visit (INDEPENDENT_AMBULATORY_CARE_PROVIDER_SITE_OTHER): Payer: Medicare Other | Admitting: Urology

## 2017-08-13 VITALS — BP 121/56 | HR 76 | Resp 16 | Ht 67.0 in | Wt 124.0 lb

## 2017-08-13 DIAGNOSIS — R310 Gross hematuria: Secondary | ICD-10-CM | POA: Diagnosis not present

## 2017-08-13 DIAGNOSIS — N200 Calculus of kidney: Secondary | ICD-10-CM | POA: Diagnosis not present

## 2017-08-13 LAB — URINALYSIS, COMPLETE
Bilirubin, UA: NEGATIVE
Glucose, UA: NEGATIVE
Ketones, UA: NEGATIVE
Nitrite, UA: POSITIVE — AB
PH UA: 7 (ref 5.0–7.5)
Specific Gravity, UA: 1.02 (ref 1.005–1.030)
Urobilinogen, Ur: 0.2 mg/dL (ref 0.2–1.0)

## 2017-08-13 LAB — MICROSCOPIC EXAMINATION
EPITHELIAL CELLS (NON RENAL): NONE SEEN /HPF (ref 0–10)
RBC, UA: >30 /hpf — ABNORMAL HIGH (ref 0–2)
WBC, UA: >30 /hpf — ABNORMAL HIGH (ref 0–5)

## 2017-08-13 MED ORDER — CIPROFLOXACIN HCL 500 MG PO TABS: 500.0000 mg | ORAL_TABLET | Freq: Once | ORAL | Status: DC

## 2017-08-13 MED ORDER — LIDOCAINE HCL URETHRAL/MUCOSAL 2 % EX GEL: 1.0000 "application " | Freq: Once | CUTANEOUS | Status: DC

## 2017-08-13 NOTE — Telephone Encounter (Signed)
CT results were discussed at today's visit

## 2017-08-13 NOTE — Progress Notes (Signed)
08/13/2017 12:43 PM   Seth Woodard 04-28-1940 [id number]  Referring provider: Adline Potter, MD [address] [name], [place] [address]  Chief Complaint  Patient presents with  . Follow-up    HPI: 77 year old male seen by Dr. Britta Woodard on 07/20/2017 for gross hematuria.  He was scheduled for cystoscopy today however urinalysis showed pyuria and microhematuria.  His cystoscopy was postponed and urine culture was ordered.  He wanted to have his CT urogram results discussed.  The CT did show a 16 mm hyperdense lesion in the left kidney with precontrast density of 38HU and postcontrast 63HU.  There was mild left hydronephrosis and hydroureter with tortuosity of the distal ureter.  Bilateral, nonobstructing small renal calculi were noted along with a 4 mm calcification in the posterior bladder.  The bladder was trabeculated and prostate enlargement was noted.   PMH: Past Medical History:  Diagnosis Date  . Heart murmur   . 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 Hyperlipidemia   . Hypertension     Surgical History: Past Surgical History:  Procedure Laterality Date  . APPENDECTOMY  1967  . CATARACT EXTRACTION W/PHACO Right 06/18/2017   Procedure: CATARACT EXTRACTION PHACO AND INTRAOCULAR LENS PLACEMENT ([place]) RIGHT;  Surgeon: Seth Koyanagi, MD;  Location: [facility];  Service: Ophthalmology;  Laterality: Right;  requests to be last  . 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 WISDOM TOOTH EXTRACTION      Home Medications:  Allergies as of 08/13/2017   No Known Allergies     Medication List        Accurate as of 08/13/17 12:43 PM. Always use your most recent med list.          atorvastatin 40 MG tablet Commonly known as:  LIPITOR Take 1 tablet (40 mg total) by mouth daily.   bisoprolol-hydrochlorothiazide 2.5-6.25 MG tablet Commonly known as:  ZIAC Take 1 tablet by mouth daily.   RA CALCIUM PLUS VITAMIN D 600-400  MG-UNIT tablet Generic drug:  Calcium Carbonate-Vitamin D Take 1 tablet by mouth daily.   tamsulosin 0.4 MG Caps capsule Commonly known as:  FLOMAX Take 1 capsule (0.4 mg total) by mouth daily.   vitamin C 500 MG tablet Commonly known as:  ASCORBIC ACID Take 500 mg by mouth daily.       Allergies: No Known Allergies  Family History: History reviewed. No pertinent family history.  Social History:  reports that he has never smoked. He has never used smokeless tobacco. He reports that he does not drink alcohol or use drugs.  ROS: No significant change from 07/20/2017  Physical Exam: BP (!) 121/56   Pulse 76   Resp 16   Ht 5\' 7"  (1.702 m)   Wt 124 lb (56.2 kg)   SpO2 98%   BMI 19.42 kg/m   Constitutional:  Alert and oriented, No acute distress. HEENT: [place] AT, moist mucus membranes.  Trachea midline, no masses. Cardiovascular: No clubbing, cyanosis, or edema. Respiratory: Normal respiratory effort, no increased work of breathing. Skin: No rashes, bruises or suspicious lesions. Neurologic: Grossly intact, no focal deficits, moving all 4 extremities. Psychiatric: Normal mood and affect.  Laboratory Data: Lab Results  Component Value Date   WBC 8.1 04/09/2017   HGB 12.4 (L) 04/09/2017   HCT 37.5 (L) 04/09/2017   MCV 88.8 04/09/2017   PLT 217 04/09/2017    Lab Results  Component Value Date  CREATININE 1.09 08/01/2017    Lab Results  Component Value Date   PSA 8.7 (H) 08/21/2012     Pertinent Imaging: Images were personally reviewed  Results for orders placed during the hospital encounter of 08/01/17  CT HEMATURIA WORKUP   Narrative CLINICAL DATA:  Gross hematuria.  Pain with urination.  EXAM: CT ABDOMEN AND PELVIS WITHOUT AND WITH CONTRAST  TECHNIQUE: Multidetector CT imaging of the abdomen and pelvis was performed following the standard protocol before and following the bolus administration of intravenous contrast.  CONTRAST:  133mL ISOVUE-300  IOPAMIDOL (ISOVUE-300) INJECTION 61%  COMPARISON:  08/11/2012.  FINDINGS: Lower chest: Calcified pleural plaques bilaterally. Heart size normal. Coronary artery calcification. No pericardial or pleural effusion.  Hepatobiliary: Low-attenuation lesions in the liver measure up to 2.0 cm, similar and likely cysts. Visualized portions of the liver and gallbladder are otherwise unremarkable. No biliary ductal dilatation.  Pancreas: Negative.  Spleen: Negative.  Adrenals/Urinary Tract: Adrenal glands are unremarkable. Stones in the kidneys bilaterally, left greater than right. 1.6 cm hyperattenuating lesion in the interpolar left kidney measures 30 Hounsfield units on precontrast imaging and 63 Hounsfield units on postcontrast imaging. No filling defects in the intrarenal collecting systems or ureters. Mild left hydronephrosis with tortuosity of the distal ureter, just proximal to the ureteral vesicle junction. Calcifications along the dependent portion of right posterior bladder measure up to 4 mm. Extensive bladder trabeculation. Bladder is suboptimally opacified, otherwise limiting evaluation.  Stomach/Bowel: Stomach, small bowel and colon are unremarkable. Appendix is not readily visualized.  Vascular/Lymphatic: Atherosclerotic calcification of the arterial vasculature without abdominal aortic aneurysm. Retroperitoneal lymph nodes are subcentimeter in short axis size.  Reproductive: Prostate is enlarged.  Other: No free fluid.  Musculoskeletal: No worrisome lytic or sclerotic lesions. Degenerative changes in the spine.  IMPRESSION: 1. Mild left hydronephrosis, possibly due to tortuosity of the distal left ureter, just proximal to the ureterovesical junction. A stenosis or obstructing lesion cannot be definitively excluded. 2. Bilateral renal stones.  Small bladder stones. 3. Enlarged prostate with marked bladder trabeculation. 4. Asbestos related pleural  disease.   Electronically Signed   By: Seth Woodard M.D.   On: 08/01/2017 15:40      Assessment & Plan:   The left renal lesion may be a hyperdense cyst and the slight increase in density unit secondary to volume averaging.  Would recommend a follow-up renal protocol MRI in approximately 3 months.  We discussed further evaluation of his mild left hydronephrosis and hydroureter with cystoscopy and retrograde pyelogram.  He does have significant bladder trabeculation however and it may be difficult to identify the left ureteral orifice.  He would like to think over these options.  He will be notified with his urine culture result.   Abbie Sons, [place] [address] [place], [place] [address] [phone]

## 2017-08-15 LAB — CULTURE, URINE COMPREHENSIVE

## 2017-08-17 ENCOUNTER — Encounter: Payer: Self-pay | Admitting: Family Medicine

## 2017-08-17 ENCOUNTER — Telehealth: Payer: Self-pay | Admitting: Family Medicine

## 2017-08-17 NOTE — Telephone Encounter (Signed)
-----   Message from Abbie Sons, MD sent at 08/17/2017 12:59 PM EDT ----- Urine culture showed no evidence of infection

## 2017-08-17 NOTE — Telephone Encounter (Signed)
Letter sent.

## 2017-09-13 ENCOUNTER — Other Ambulatory Visit: Payer: Medicare Other | Admitting: Urology

## 2017-10-03 ENCOUNTER — Telehealth: Payer: Self-pay | Admitting: Urology

## 2017-10-03 MED ORDER — TAMSULOSIN HCL 0.4 MG PO CAPS
0.4000 mg | ORAL_CAPSULE | Freq: Every day | ORAL | 11 refills | Status: DC
Start: 2017-10-03 — End: 2017-12-18

## 2017-10-03 NOTE — Telephone Encounter (Signed)
RX sent to [facility] 

## 2017-10-03 NOTE — Telephone Encounter (Signed)
Pt called office requesting a refill, states mail order pharmacy [facility]) tells him that they have tried multiple attempts to reach our office for this refill with no success.  Pt calling asking for a refill on Tamsulosin. Please advise pt @ [phone] and or [facility] about refill. Thanks.

## 2017-12-18 ENCOUNTER — Telehealth: Payer: Self-pay | Admitting: Urology

## 2017-12-18 DIAGNOSIS — N400 Enlarged prostate without lower urinary tract symptoms: Secondary | ICD-10-CM

## 2017-12-18 DIAGNOSIS — Z125 Encounter for screening for malignant neoplasm of prostate: Secondary | ICD-10-CM

## 2017-12-18 MED ORDER — TAMSULOSIN HCL 0.4 MG PO CAPS
0.4000 mg | ORAL_CAPSULE | Freq: Every day | ORAL | 11 refills | Status: DC
Start: 2017-12-18 — End: 2018-12-17

## 2017-12-18 NOTE — Telephone Encounter (Signed)
Done, see previous telephone encounter.

## 2017-12-18 NOTE — Telephone Encounter (Signed)
Caller called after hours advice line stating that he needs office to call [phone] to have Rx changed over to [email or web address] from [facility]. Caller can be reached at [phone]. Thanks.

## 2017-12-18 NOTE — Telephone Encounter (Signed)
Pt called and would like ALL future prescriptions through [facility].  Also, he would like Tamsulosin sent in to [email or web address]  Phone# [phone]

## 2017-12-18 NOTE — Telephone Encounter (Signed)
RX sent to new pharmacy per patient's request. Pt informed via [facility]

## 2017-12-18 NOTE — Addendum Note (Signed)
Addended by: Donalee Citrin on: 12/18/2017 11:38 AM   Modules accepted: Orders

## 2018-02-13 ENCOUNTER — Telehealth: Payer: Self-pay | Admitting: Urology

## 2018-02-13 DIAGNOSIS — N2889 Other specified disorders of kidney and ureter: Secondary | ICD-10-CM

## 2018-02-13 NOTE — Telephone Encounter (Signed)
Patient called and left a message that he is back and would like to have the MRI that was discussed at his last visit. If you still want him to have this can you put an order in for me please?   Thanks, Sharyn Lull

## 2018-02-14 NOTE — Telephone Encounter (Signed)
Order was entered 

## 2018-03-04 ENCOUNTER — Other Ambulatory Visit: Payer: Self-pay | Admitting: Urology

## 2018-03-04 DIAGNOSIS — R972 Elevated prostate specific antigen [PSA]: Secondary | ICD-10-CM

## 2018-03-04 DIAGNOSIS — N2889 Other specified disorders of kidney and ureter: Secondary | ICD-10-CM

## 2018-03-04 NOTE — Progress Notes (Signed)
Orders in for BUN and creatinine.

## 2018-03-05 ENCOUNTER — Other Ambulatory Visit
Admission: RE | Admit: 2018-03-05 | Discharge: 2018-03-05 | Disposition: A | Discharge status: Home / Self Care | Payer: Medicare Other | Source: Ambulatory Visit | Attending: Urology | Admitting: Urology

## 2018-03-05 ENCOUNTER — Ambulatory Visit
Admission: RE | Admit: 2018-03-05 | Discharge: 2018-03-05 | Disposition: A | Discharge status: Home / Self Care | Payer: Medicare Other | Source: Ambulatory Visit | Attending: Urology | Admitting: Urology

## 2018-03-05 DIAGNOSIS — K862 Cyst of pancreas: Secondary | ICD-10-CM | POA: Diagnosis not present

## 2018-03-05 DIAGNOSIS — R972 Elevated prostate specific antigen [PSA]: Secondary | ICD-10-CM

## 2018-03-05 DIAGNOSIS — N2889 Other specified disorders of kidney and ureter: Secondary | ICD-10-CM | POA: Diagnosis present

## 2018-03-05 LAB — CREATININE, SERUM
CREATININE: 1.24 mg/dL (ref 0.61–1.24)
GFR calc Af Amer: >60 mL/min (ref >60)
GFR, EST NON AFRICAN AMERICAN: 55 mL/min — AB (ref >60)

## 2018-03-05 MED ORDER — GADOBUTROL 1 MMOL/ML IV SOLN
5.0000 mL | Freq: Once | INTRAVENOUS | Status: AC | PRN
  Administered 2018-03-05: 5 mL via INTRAVENOUS

## 2018-03-05 NOTE — Addendum Note (Signed)
Addended by: Memory Argue H on: 03/05/2018 09:21 AM   Modules accepted: Orders

## 2018-03-07 ENCOUNTER — Ambulatory Visit (INDEPENDENT_AMBULATORY_CARE_PROVIDER_SITE_OTHER): Payer: Medicare Other | Admitting: Urology

## 2018-03-07 ENCOUNTER — Encounter: Payer: Self-pay | Admitting: Urology

## 2018-03-07 ENCOUNTER — Other Ambulatory Visit: Payer: Self-pay

## 2018-03-07 VITALS — BP 136/68 | HR 70 | Ht 67.0 in | Wt 123.1 lb

## 2018-03-07 DIAGNOSIS — N1330 Unspecified hydronephrosis: Secondary | ICD-10-CM | POA: Diagnosis not present

## 2018-03-07 DIAGNOSIS — C642 Malignant neoplasm of left kidney, except renal pelvis: Secondary | ICD-10-CM | POA: Insufficient documentation

## 2018-03-07 DIAGNOSIS — N2889 Other specified disorders of kidney and ureter: Secondary | ICD-10-CM

## 2018-03-07 NOTE — Progress Notes (Signed)
03/07/2018 8:59 PM   Seth Woodard June 29, 1940 [id number]  Referring provider: No referring provider defined for this encounter.  Chief Complaint  Patient presents with  . Follow-up    HPI: 77 year old male presents for follow-up.  He was initially seen in March 2019 by Dr. Pilar Jarvis after an episode of gross hematuria.  He was scheduled for cystoscopy and April 2019 however this was not performed secondary to pyuria.  A urine culture subsequently grew mixed flora.  CT urogram did show a 16 mm hyperattenuating lesion in the left kidney.  A follow-up abdominal MRI with and without contrast was recommended and was performed earlier this week.  We also discussed options at that visit of rescheduling cystoscopy versus cystoscopy with retrograde pyelogram as his CT also showed mild left hydronephrosis and tortuosity of the distal ureter.  He elected not to pursue cystoscopy.  He denies recurrent gross hematuria.  Abdominal MRI performed on 03/05/2018 showed small subcentimeter right renal cysts and a 1.7 cm Bosniak 2 hemorrhagic cyst in the upper pole of the left kidney.  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  He had persistent mild left hydronephrosis.  He was also noted to have 2 small cystic lesions in the pancreas and follow-up abdominal MRI imaging was recommended in 2 years per ACR guidelines.  PMH: Past Medical History:  Diagnosis Date  . Heart murmur   . 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 Hyperlipidemia   . Hypertension     Surgical History: Past Surgical History:  Procedure Laterality Date  . APPENDECTOMY  1967  . CATARACT EXTRACTION W/PHACO Right 06/18/2017   Procedure: CATARACT EXTRACTION PHACO AND INTRAOCULAR LENS PLACEMENT ([place]) RIGHT;  Surgeon: Leandrew Koyanagi, MD;  Location: [facility];  Service: Ophthalmology;  Laterality: Right;  requests to be last  . 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 WISDOM TOOTH EXTRACTION      Home Medications:  Allergies as of 03/07/2018   No Known Allergies     Medication List        Accurate as of 03/07/18  8:59 PM. Always use your most recent med list.          atorvastatin 40 MG tablet Commonly known as:  LIPITOR Take 1 tablet (40 mg total) by mouth daily.   bisoprolol-hydrochlorothiazide 2.5-6.25 MG tablet Commonly known as:  ZIAC Take 1 tablet by mouth daily.   RA CALCIUM PLUS VITAMIN D 600-400 MG-UNIT tablet Generic drug:  Calcium Carbonate-Vitamin D Take 1 tablet by mouth daily.   tamsulosin 0.4 MG Caps capsule Commonly known as:  FLOMAX Take 1 capsule (0.4 mg total) by mouth daily.   vitamin C 500 MG tablet Commonly known as:  ASCORBIC ACID Take 500 mg by mouth daily.       Allergies: No Known Allergies  Family History: History reviewed. No pertinent family history.  Social History:  reports that he has never smoked. He has never used smokeless tobacco. He reports that he does not drink alcohol or use drugs.  ROS: UROLOGY Frequent Urination?: Yes Hard to postpone urination?: Yes Burning/pain with urination?: Yes Get up at night to urinate?: Yes Leakage of urine?: No Urine stream starts and stops?: No Trouble starting stream?: No Do you have  to strain to urinate?: No Blood in urine?: No Urinary tract infection?: No Sexually transmitted disease?: No Injury to kidneys or bladder?: No Painful intercourse?: No Weak stream?: No Erection problems?: No Penile pain?: No  Gastrointestinal Nausea?: No Vomiting?: No Indigestion/heartburn?: No Diarrhea?: No Constipation?: No  Constitutional Fever: No Night sweats?: No Weight loss?: No Fatigue?: No  Skin Skin rash/lesions?: No Itching?: Yes  Eyes Blurred vision?: No Double vision?:  No  Ears/Nose/Throat Sore throat?: No Sinus problems?: No  Hematologic/Lymphatic Swollen glands?: No Easy bruising?: No  Cardiovascular Leg swelling?: No Chest pain?: No  Respiratory Cough?: No Shortness of breath?: No  Endocrine Excessive thirst?: No  Musculoskeletal Back pain?: No Joint pain?: No  Neurological Headaches?: No Dizziness?: No  Psychologic Depression?: No Anxiety?: No  Physical Exam: BP 136/68   Pulse 70   Ht 5\' 7"  (1.702 m)   Wt 123 lb 1.6 oz (55.8 kg)   BMI 19.28 kg/m   Constitutional:  Alert and oriented, No acute distress.   Assessment & Plan:   MRI findings were discussed in detail with Mr. Breau.  Possibility of a small renal cell carcinoma was discussed as well as benign renal masses.  Management options were discussed including surveillance, renal mass biopsy, percutaneous ablation techniques and surgical excision.  He has elected surveillance for now and will schedule follow-up imaging and 4 months.  He had declined cystoscopy/retrograde pyelogram.  A urine was not obtained today and he will schedule a follow-up lab visit for urinalysis.  If he has persistent microhematuria will readdress cystoscopy.  Greater than 50% of this 15-minute visit was spent counseling the patient.   Abbie Sons, [place] [address], [place], [place] [address] [phone]

## 2018-03-08 ENCOUNTER — Telehealth: Payer: Self-pay | Admitting: Family Medicine

## 2018-03-08 NOTE — Telephone Encounter (Signed)
LMOM for patient to call back and schedule a urine drop off on lab schedule.

## 2018-03-08 NOTE — Telephone Encounter (Signed)
-----   Message from Abbie Sons, MD sent at 03/07/2018  9:31 PM EST ----- Please have patient bring in a urinalysis sometime next week as he did not have one at yesterday's visit.

## 2018-03-13 ENCOUNTER — Telehealth: Payer: Self-pay

## 2018-03-13 NOTE — Telephone Encounter (Signed)
Patient called and left a VM to let us know he is choosing to go with Dr Army Melia for his care since [place] no longer works in office. Called to schedule appt with Dr Army Melia.   Needs 40 mins in new patient slot since never been seen by [place].  Thank you.  CB# [phone]

## 2018-03-19 ENCOUNTER — Other Ambulatory Visit
Admission: RE | Admit: 2018-03-19 | Discharge: 2018-03-19 | Disposition: A | Discharge status: Home / Self Care | Payer: Medicare Other | Source: Ambulatory Visit | Attending: Urology | Admitting: Urology

## 2018-03-19 ENCOUNTER — Telehealth: Payer: Self-pay | Admitting: Urology

## 2018-03-19 ENCOUNTER — Telehealth: Payer: Self-pay

## 2018-03-19 ENCOUNTER — Other Ambulatory Visit: Payer: Self-pay | Admitting: Urology

## 2018-03-19 DIAGNOSIS — R310 Gross hematuria: Secondary | ICD-10-CM

## 2018-03-19 DIAGNOSIS — Z87442 Personal history of urinary calculi: Secondary | ICD-10-CM | POA: Insufficient documentation

## 2018-03-19 LAB — URINALYSIS, COMPLETE (UACMP) WITH MICROSCOPIC
Bilirubin Urine: NEGATIVE
Glucose, UA: NEGATIVE mg/dL
KETONES UR: NEGATIVE mg/dL
Nitrite: POSITIVE — AB
Specific Gravity, Urine: 1.02 (ref 1.005–1.030)
pH: 7.5 (ref 5.0–8.0)

## 2018-03-19 NOTE — Telephone Encounter (Signed)
-----   Message from Abbie Sons, MD sent at 03/19/2018  4:40 PM EST ----- Urinalysis looks consistent with infection.  Is he having symptoms?.  Please see if the lab in [place] can run a culture on the specimen.

## 2018-03-19 NOTE — Telephone Encounter (Signed)
Pt would like to give urine sample in [place] instead of [place] either today or tomorrow.  Seth Woodard called pt to let him know he needs to leave sample.  If someone could please make sure order is in for pt to leave urine sample at [facility] lab.

## 2018-03-19 NOTE — Telephone Encounter (Signed)
Order placed for [place].

## 2018-03-20 ENCOUNTER — Other Ambulatory Visit: Payer: Self-pay

## 2018-03-20 ENCOUNTER — Telehealth: Payer: Self-pay | Admitting: Urology

## 2018-03-20 ENCOUNTER — Other Ambulatory Visit: Admission: RE | Admit: 2018-03-20 | Payer: Medicare Other | Source: Ambulatory Visit | Admitting: Urology

## 2018-03-20 DIAGNOSIS — R310 Gross hematuria: Secondary | ICD-10-CM

## 2018-03-20 MED ORDER — SULFAMETHOXAZOLE-TRIMETHOPRIM 800-160 MG PO TABS: 1.0000 | ORAL_TABLET | Freq: Two times a day (BID) | ORAL | 0 refills | Status: DC

## 2018-03-20 NOTE — Telephone Encounter (Signed)
Patient notified that per Dr. Bernardo Heater script was sent to pharmacy for him to start and will call with urine results next week

## 2018-03-20 NOTE — Telephone Encounter (Signed)
Spoke with the patient and he stated that he is having painful,diffuclty and frequent urination   michelle

## 2018-03-20 NOTE — Telephone Encounter (Signed)
Pt is headed over to [facility] lab again now to leave urine sample that was discarded yesterday.  He wants to know if an abx will be called in so he can start before the holidays.

## 2018-03-22 LAB — URINE CULTURE

## 2018-03-26 ENCOUNTER — Telehealth: Payer: Self-pay

## 2018-03-26 NOTE — Telephone Encounter (Signed)
-----   Message from Abbie Sons, MD sent at 03/25/2018  7:49 AM EST ----- Urine culture was negative for infection.  Due to his abnormal urinalysis and hydronephrosis on CT and MRI recommend scheduling cystoscopy and retrograde pyelogram.  If he declines would recommend a follow-up appointment to discuss further.

## 2018-03-26 NOTE — Telephone Encounter (Signed)
Patient called back and stated that he would let us know when he was able to schedule and cysto. He would call back. Results given and all questions anwsered   Sharyn Lull

## 2018-03-26 NOTE — Telephone Encounter (Signed)
Called pt, no answer. Left generic message for pt to call office back. 1st attempt.

## 2018-09-11 ENCOUNTER — Other Ambulatory Visit: Payer: Self-pay | Admitting: Family Medicine

## 2018-09-17 ENCOUNTER — Ambulatory Visit: Payer: Medicare Other | Admitting: Urology

## 2018-09-25 ENCOUNTER — Ambulatory Visit (INDEPENDENT_AMBULATORY_CARE_PROVIDER_SITE_OTHER): Payer: Medicare Other | Admitting: Internal Medicine

## 2018-09-25 ENCOUNTER — Other Ambulatory Visit: Payer: Self-pay

## 2018-09-25 ENCOUNTER — Encounter: Payer: Self-pay | Admitting: Internal Medicine

## 2018-09-25 VITALS — BP 122/70 | HR 62 | Ht 67.0 in | Wt 121.0 lb

## 2018-09-25 DIAGNOSIS — I2510 Atherosclerotic heart disease of native coronary artery without angina pectoris: Secondary | ICD-10-CM | POA: Diagnosis not present

## 2018-09-25 DIAGNOSIS — E785 Hyperlipidemia, unspecified: Secondary | ICD-10-CM

## 2018-09-25 DIAGNOSIS — C642 Malignant neoplasm of left kidney, except renal pelvis: Secondary | ICD-10-CM

## 2018-09-25 DIAGNOSIS — I10 Essential (primary) hypertension: Secondary | ICD-10-CM | POA: Diagnosis not present

## 2018-09-25 DIAGNOSIS — Z23 Encounter for immunization: Secondary | ICD-10-CM

## 2018-09-25 NOTE — Progress Notes (Signed)
Date:  09/25/2018   Name:  Seth Woodard   DOB:  1940/10/19   MRN:  [id number]   Chief Complaint: No chief complaint on file. Establish care.  Previous patient of Dr. Vicente Woodard.  He has Urology as needed.  He also continues to see [facility].  Hypertension  This is a chronic problem. The problem is controlled. Pertinent negatives include no chest pain, headaches, palpitations or shortness of breath. Past treatments include beta blockers and diuretics. The current treatment provides significant improvement. Hypertensive end-organ damage includes CAD/MI (remote CABG x 4).  Hyperlipidemia  This is a chronic problem. Pertinent negatives include no chest pain or shortness of breath. Current antihyperlipidemic treatment includes statins. The current treatment provides significant (no lipid tests done since 2017) improvement of lipids.  CAD - remote CABG.  Last year had stress testing and ECHO that was normal.  He had lost a significant amount of weight and has kept it off.  He walks daily and remains very active with no CP, SOB.  Renal Cell CA - noted on scan done to work up hematuria.  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  Cryoablation planned but then postponed due to Covid-19 restrictions.  Now planning procedure in the fall.   Review of Systems  Constitutional: Negative for chills, fatigue, fever and unexpected weight change.  Respiratory: Negative for cough, chest tightness, shortness of breath and wheezing.   Cardiovascular: Negative for chest pain, palpitations and leg swelling.  Gastrointestinal: Negative for abdominal pain and constipation.  Genitourinary: Negative for dysuria, hematuria and urgency.  Musculoskeletal: Negative for arthralgias.  Skin: Negative for color change and rash.  Neurological: Negative for dizziness, light-headedness and headaches.  Psychiatric/Behavioral: Negative for sleep disturbance.    Patient Active Problem List   Diagnosis Date  Noted  . Renal cell cancer, left ([place]) 03/07/2018  . Hydronephrosis 03/07/2018  . History of four vessel coronary artery bypass graft 04/11/2017  . CAD (coronary artery disease) 04/09/2017  . Hypertension 04/09/2017  . Elevated PSA 04/09/2017  . 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 Hyperlipidemia 04/09/2017  . History of kidney stones 04/09/2017  . Anemia, normocytic normochromic 04/09/2017    No Known Allergies  Past Surgical History:  Procedure Laterality Date  . APPENDECTOMY  1967  . CATARACT EXTRACTION W/PHACO Right 06/18/2017   Procedure: CATARACT EXTRACTION PHACO AND INTRAOCULAR LENS PLACEMENT ([place]) RIGHT;  Surgeon: Leandrew Koyanagi, MD;  Location: [facility];  Service: Ophthalmology;  Laterality: Right;  requests to be last  . 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 WISDOM TOOTH EXTRACTION      Social History   Tobacco Use  . Smoking status: Never Smoker  . Smokeless tobacco: Never Used  Substance Use Topics  . Alcohol use: No    Frequency: Never  . Drug use: No     Medication list has been reviewed and updated.  Current Meds  Medication Sig  . atorvastatin (LIPITOR) 80 MG tablet   . bisoprolol-hydrochlorothiazide (ZIAC) 2.5-6.25 MG tablet Take 1 tablet by mouth daily.  . Calcium Carbonate-Vitamin D (RA CALCIUM PLUS VITAMIN D) 600-400 MG-UNIT tablet Take 1 tablet by mouth daily.  . tamsulosin (FLOMAX) 0.4 MG CAPS capsule Take 1 capsule (0.4 mg total) by mouth daily.  . vitamin C (ASCORBIC ACID) 500 MG tablet Take 500 mg by mouth daily.    PHQ 2/9 Scores 09/25/2018 04/09/2017  PHQ - 2 Score 0 0  BP Readings from Last 3 Encounters:  09/25/18 122/70  03/07/18 136/68  08/13/17 (!) 121/56    Physical Exam Vitals signs and nursing note reviewed.  Constitutional:      General: He is not in acute distress.    Appearance: He is well-developed.  HENT:     Head: Normocephalic and atraumatic.  Eyes:     Pupils: Pupils are  equal, round, and reactive to light.  Neck:     Musculoskeletal: Normal range of motion and neck supple.     Vascular: No carotid bruit.  Cardiovascular:     Rate and Rhythm: Normal rate and regular rhythm.     Heart sounds: No murmur.  Pulmonary:     Effort: Pulmonary effort is normal. No respiratory distress.     Breath sounds: No wheezing or rhonchi.  Musculoskeletal:     Right lower leg: No edema.     Left lower leg: No edema.  Lymphadenopathy:     Cervical: No cervical adenopathy.  Skin:    General: Skin is warm and dry.     Capillary Refill: Capillary refill takes less than 2 seconds.     Findings: No rash.  Neurological:     Mental Status: He is alert and oriented to person, place, and time.  Psychiatric:        Attention and Perception: Attention and perception normal.        Mood and Affect: Mood normal.        Speech: Speech normal.        Behavior: Behavior normal.        Thought Content: Thought content normal.     Wt Readings from Last 3 Encounters:  09/25/18 121 lb (54.9 kg)  03/07/18 123 lb 1.6 oz (55.8 kg)  08/13/17 124 lb (56.2 kg)    BP 122/70   Pulse 62   Ht 5\' 7"  (1.702 m)   Wt 121 lb (54.9 kg)   SpO2 99%   BMI 18.95 kg/m   Assessment and Plan: 1. Coronary artery disease involving native coronary artery of native heart without angina pectoris Not taking aspirin daily by his choice Doing well - follow up with cardiology  2. Essential hypertension controlled  3. Hyperlipidemia, unspecified hyperlipidemia type Continue statin therapy  4. Renal cell cancer, left ([place]) Will follow up with [facility]  5. Need for vaccination for pneumococcus - Pneumococcal polysaccharide vaccine 23-valent greater than or equal to 2yo subcutaneous/IM   Partially dictated using Editor, commissioning. Any errors are unintentional.  Halina Maidens, MD [facility]  09/25/2018

## 2018-09-25 NOTE — Patient Instructions (Signed)

## 2018-12-17 ENCOUNTER — Other Ambulatory Visit: Payer: Self-pay

## 2018-12-17 DIAGNOSIS — N400 Enlarged prostate without lower urinary tract symptoms: Secondary | ICD-10-CM

## 2018-12-17 MED ORDER — TAMSULOSIN HCL 0.4 MG PO CAPS: 0.4000 mg | ORAL_CAPSULE | Freq: Every day | ORAL | 2 refills | Status: DC

## 2018-12-18 ENCOUNTER — Other Ambulatory Visit: Payer: Self-pay | Admitting: Urology

## 2018-12-18 DIAGNOSIS — N400 Enlarged prostate without lower urinary tract symptoms: Secondary | ICD-10-CM

## 2018-12-18 NOTE — Telephone Encounter (Signed)
Pt called and states that he needs a new rx sent to [email or web address] of Tamsulosin. He needs it to be written for 90 days instead of 30 days.

## 2018-12-19 ENCOUNTER — Encounter: Payer: Self-pay | Admitting: Family Medicine

## 2018-12-19 MED ORDER — TAMSULOSIN HCL 0.4 MG PO CAPS: 0.4000 mg | ORAL_CAPSULE | Freq: Every day | ORAL | 1 refills | Status: DC

## 2018-12-19 NOTE — Telephone Encounter (Signed)
RX sent, Mchart message sent

## 2019-01-12 IMAGING — CT CT ABD-PEL WO/W CM
2 of 6 series · 13 of 32 positions shown, 18 images · IV contrast (APPLIED)
Comparison: 08/11/2012.

CLINICAL DATA: Gross hematuria.  Pain with urination.

EXAM:
CT ABDOMEN AND PELVIS WITHOUT AND WITH CONTRAST
TECHNIQUE: Multidetector CT imaging of the abdomen and pelvis was performed
following the standard protocol before and following the bolus
administration of intravenous contrast.
CONTRAST:  125mL SHSE0M-XVV IOPAMIDOL (SHSE0M-XVV) INJECTION 61%

[Series 4: axial post · axial · 0.72mm/px · z∈[-855,-545]mm · 7 of 84 slices shown, 12 images]
[im 11/84  soft-tissue]
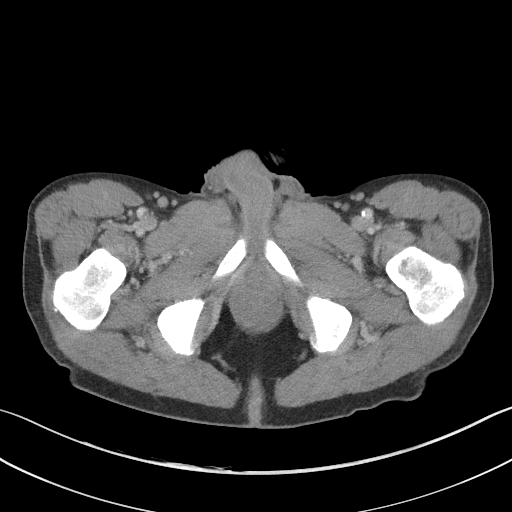
[im 11/84  bone]
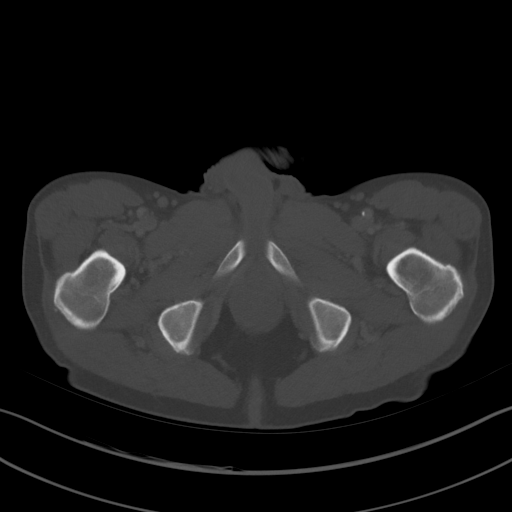
[im 21/84  soft-tissue]
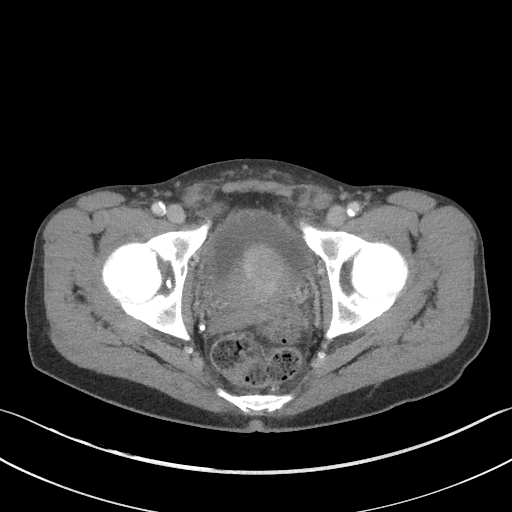
[im 32/84  soft-tissue]
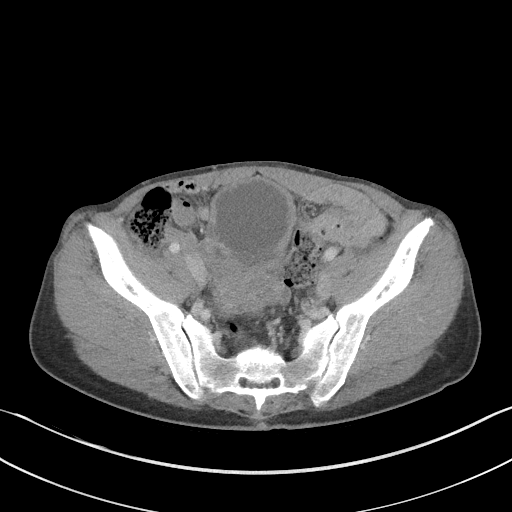
[im 42/84  soft-tissue]
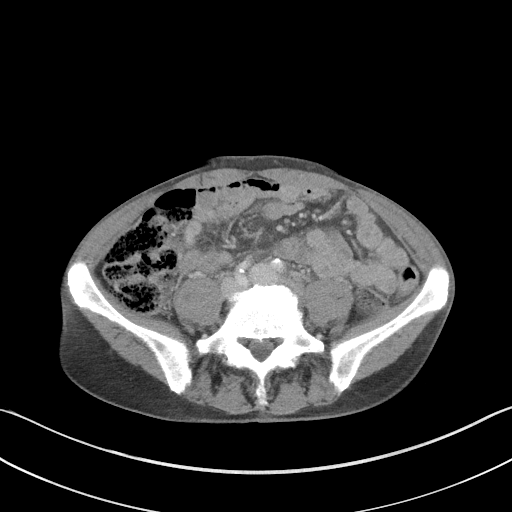
[im 42/84  lung]
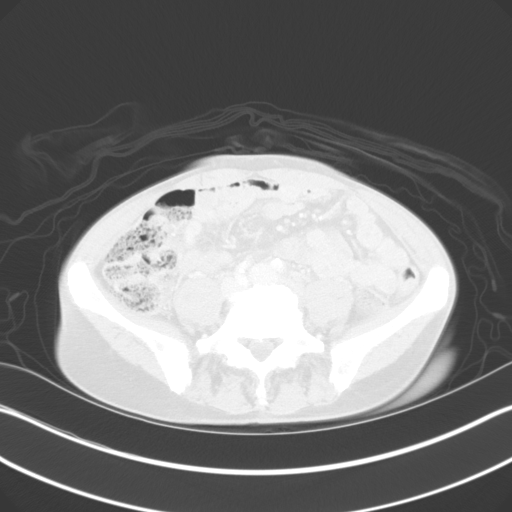
[im 52/84  soft-tissue]
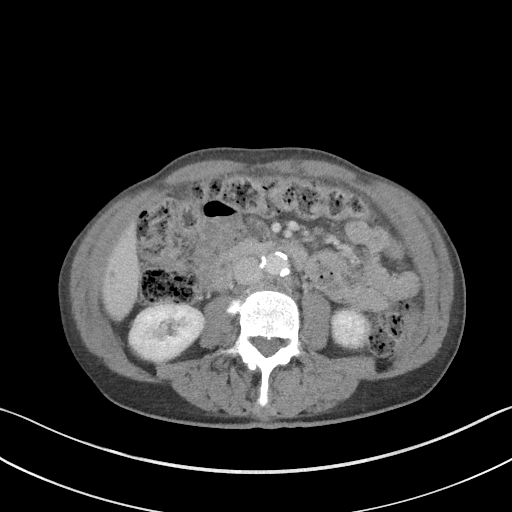
[im 52/84  lung]
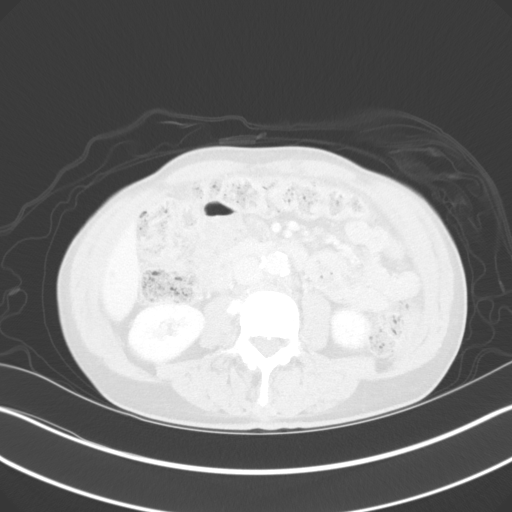
[im 63/84  soft-tissue]
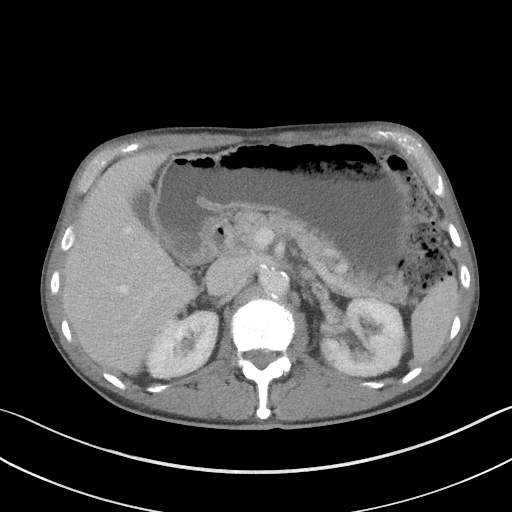
[im 63/84  lung]
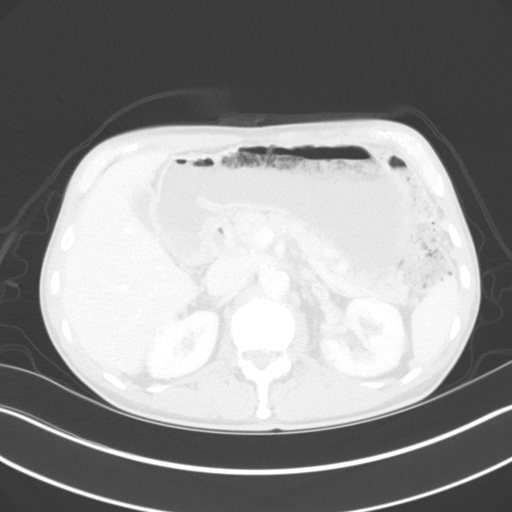
[im 73/84  soft-tissue]
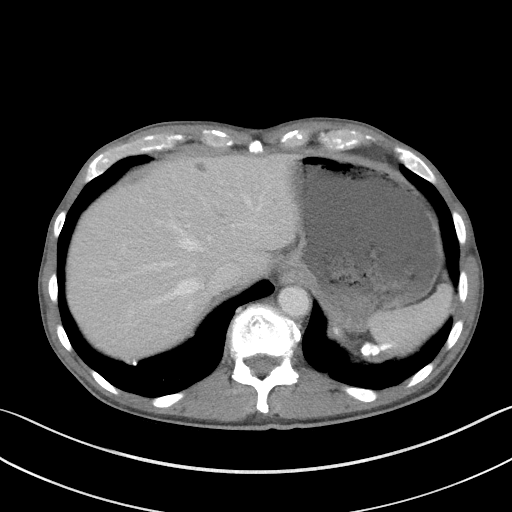
[im 73/84  lung]
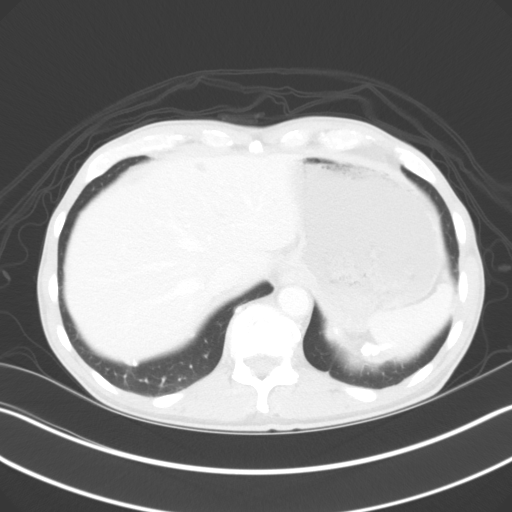

[Series 7: axial delay · axial · delayed · 0.70mm/px · z∈[-907,-647]mm · 6 of 84 slices shown]
[im 11/84  soft-tissue]
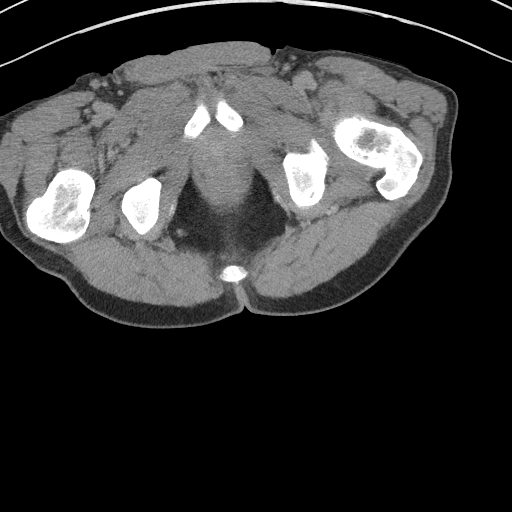
[im 21/84  soft-tissue]
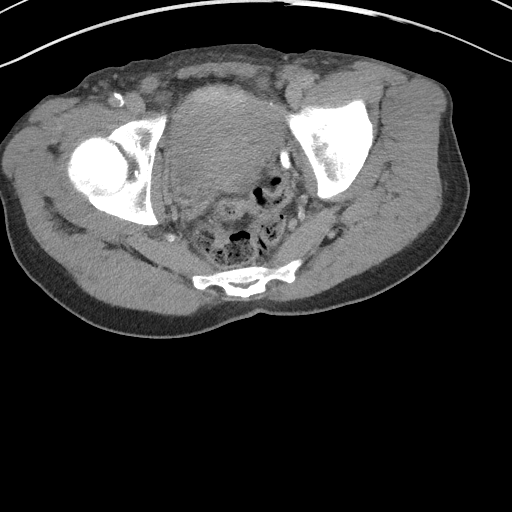
[im 32/84  soft-tissue]
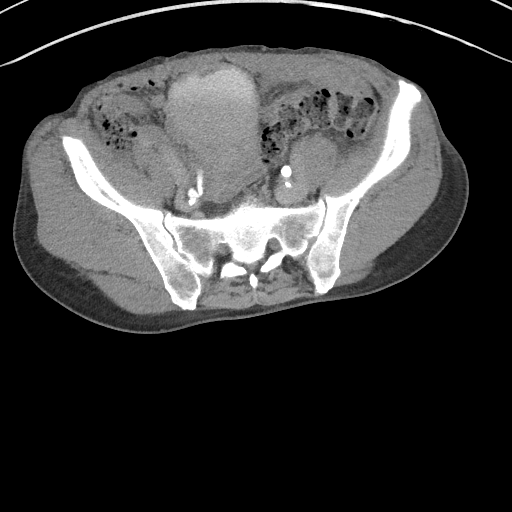
[im 42/84  soft-tissue]
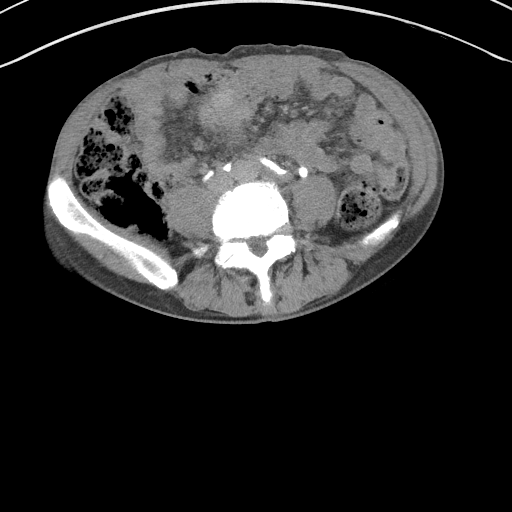
[im 52/84  soft-tissue]
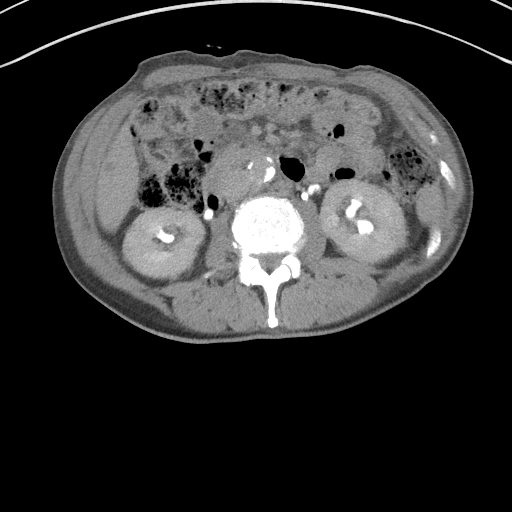
[im 63/84  soft-tissue]
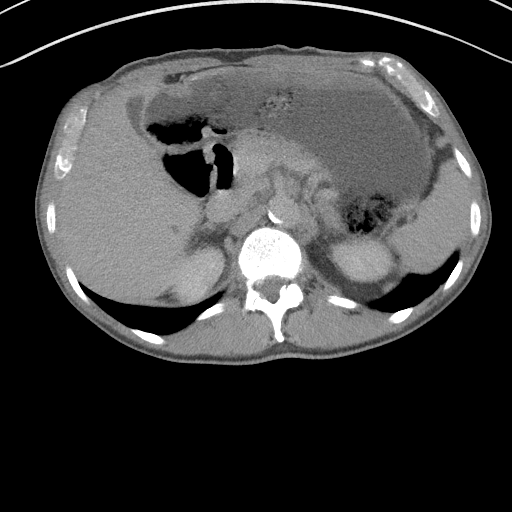

[13 of 32 positions shown; findings below may reference images not displayed]

FINDINGS: Lower chest: Calcified pleural plaques bilaterally. Heart size
normal. Coronary artery calcification. No pericardial or pleural
effusion.

Hepatobiliary: Low-attenuation lesions in the liver measure up to
2.0 cm, similar and likely cysts. Visualized portions of the liver
and gallbladder are otherwise unremarkable. No biliary ductal
dilatation.

Pancreas: Negative.

Spleen: Negative.

Adrenals/Urinary Tract: Adrenal glands are unremarkable. Stones in
the kidneys bilaterally, left greater than right. 1.6 cm
hyperattenuating lesion in the interpolar left kidney measures 30
Hounsfield units on precontrast imaging and 63 Hounsfield units on
postcontrast imaging. No filling defects in the intrarenal
collecting systems or ureters. Mild left hydronephrosis with
tortuosity of the distal ureter, just proximal to the ureteral
vesicle junction. Calcifications along the dependent portion of
right posterior bladder measure up to 4 mm. Extensive bladder
trabeculation. Bladder is suboptimally opacified, otherwise limiting
evaluation.

Stomach/Bowel: Stomach, small bowel and colon are unremarkable.
Appendix is not readily visualized.

Vascular/Lymphatic: Atherosclerotic calcification of the arterial
vasculature without abdominal aortic aneurysm. Retroperitoneal lymph
nodes are subcentimeter in short axis size.

Reproductive: Prostate is enlarged.

Other: No free fluid.

Musculoskeletal: No worrisome lytic or sclerotic lesions.
Degenerative changes in the spine.
IMPRESSION: 1. Mild left hydronephrosis, possibly due to tortuosity of the
distal left ureter, just proximal to the ureterovesical junction. A
stenosis or obstructing lesion cannot be definitively excluded.
2. Bilateral renal stones.  Small bladder stones.
3. Enlarged prostate with marked bladder trabeculation.
4. Asbestos related pleural disease.

## 2019-01-13 ENCOUNTER — Telehealth: Payer: Self-pay | Admitting: Family Medicine

## 2019-01-13 NOTE — Telephone Encounter (Signed)
Spoke to patient and he states the [employer] [facility] mail my prescription mail service he uses states we sent in an order for 1 pill with no refills. I informed him that on 12/19/18 the rx sent to the company was for 90 day supply with 1 refill. He is contacting the company.

## 2019-01-13 NOTE — Telephone Encounter (Signed)
Pt called and states that [facility] notified him that only 1 pill was called in. Pt would like call back [phone].

## 2019-03-17 ENCOUNTER — Other Ambulatory Visit: Payer: Self-pay | Admitting: Urology

## 2019-03-17 DIAGNOSIS — R310 Gross hematuria: Secondary | ICD-10-CM

## 2019-03-26 ENCOUNTER — Other Ambulatory Visit: Payer: Self-pay | Admitting: Urology

## 2019-03-26 DIAGNOSIS — N400 Enlarged prostate without lower urinary tract symptoms: Secondary | ICD-10-CM

## 2019-04-29 ENCOUNTER — Ambulatory Visit
Admission: EM | Admit: 2019-04-29 | Discharge: 2019-04-29 | Disposition: A | Discharge status: Home / Self Care | Payer: Medicare Other | Attending: Emergency Medicine | Admitting: Emergency Medicine

## 2019-04-29 ENCOUNTER — Encounter: Payer: Self-pay | Admitting: Emergency Medicine

## 2019-04-29 ENCOUNTER — Other Ambulatory Visit: Payer: Self-pay

## 2019-04-29 DIAGNOSIS — Z0110 Encounter for examination of ears and hearing without abnormal findings: Secondary | ICD-10-CM

## 2019-04-29 DIAGNOSIS — H6123 Impacted cerumen, bilateral: Secondary | ICD-10-CM

## 2019-04-29 NOTE — Discharge Instructions (Addendum)
May use Debrox once a week to help prevent repeated earwax buildup.  You can find this at any grocery or drugstore.  Your ear exam was otherwise normal today.

## 2019-04-29 NOTE — ED Triage Notes (Signed)
Pt c/o left earfullness or having water in his ear. Started about a week ago. No pain.

## 2019-04-29 NOTE — ED Provider Notes (Signed)
HPI  SUBJECTIVE:  Seth Woodard is a 79 y.o. male who presents with left ear "gurgling" over the past few days, particularly when he pushes on the posterior aspect of his ear.  He denies change in hearing, ear pain, otorrhea, fevers, nasal congestion, vertigo, tinnitus, trauma, exposure to loud noise, foreign body insertion, states he does not wear earplugs, hearing aids.  Denies jaw pain, popping, clicking.  He has been taking Benadryl for his seasonal allergies with improvement, it does not affect the ear.  No alleviating factors.  Symptoms are worse when he pushes on the posterior part of his ear or when he opens his mouth wide.  Is not associated with yawning or chewing.  Past medical history of seasonal allergies.  No history of diabetes, TMJ arthralgia, eustachian tube dysfunction.  PMD: Dr. Army Melia.    Past Medical History:  Diagnosis Date  . Heart murmur   . 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 Hyperlipidemia   . Hypertension     Past Surgical History:  Procedure Laterality Date  . APPENDECTOMY  1967  . CATARACT EXTRACTION W/PHACO Right 06/18/2017   Procedure: CATARACT EXTRACTION PHACO AND INTRAOCULAR LENS PLACEMENT ([place]) RIGHT;  Surgeon: Leandrew Koyanagi, MD;  Location: [facility];  Service: Ophthalmology;  Laterality: Right;  requests to be last  . 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 WISDOM TOOTH EXTRACTION      History reviewed. No pertinent family history.  Social History   Tobacco Use  . Smoking status: Never Smoker  . Smokeless tobacco: Never Used  Substance Use Topics  . Alcohol use: No  . Drug use: No    No current facility-administered medications for this encounter.  Current Outpatient Medications:  .  atorvastatin (LIPITOR) 80 MG tablet, , Disp: , Rfl:  .  bisoprolol (ZEBETA) 5 MG tablet, Take by mouth., Disp: , Rfl:  .  Calcium Carbonate-Vitamin D (RA CALCIUM PLUS VITAMIN D) 600-400 MG-UNIT tablet, Take  1 tablet by mouth daily., Disp: , Rfl:  .  tamsulosin (FLOMAX) 0.4 MG CAPS capsule, Take 1 capsule (0.4 mg total) by mouth daily., Disp: 90 capsule, Rfl: 1 .  vitamin C (ASCORBIC ACID) 500 MG tablet, Take 500 mg by mouth daily., Disp: , Rfl:   No Known Allergies   ROS  As noted in HPI.   Physical Exam  BP 123/60 (BP Location: Right Arm)   Pulse 61   Temp 98.1 F (36.7 C) (Oral)   Resp 18   Ht 5\' 7"  (1.702 m)   Wt 54.4 kg   SpO2 100%   BMI 18.79 kg/m   Constitutional: Well developed, well nourished, no acute distress Eyes:  EOMI, conjunctiva normal bilaterally HENT: Normocephalic, atraumatic,mucus membranes moist.   Left External ear normal.  No tenderness with traction on pinna, palpation of tragus, palpation of mastoid.  Left EAC partially obscured with cerumen.  Curetted this out.  TM appears normal, intact.  No evidence of infection, air-fluid levels, perforation.   hearing grossly intact.  Right TM obscured by cerumen.  Curetted this out.  Right TM normal.  No tenderness over TMJ bilaterally. Respiratory: Normal inspiratory effort Cardiovascular: Normal rate GI: nondistended skin: No rash, skin intact Musculoskeletal: no deformities Neurologic: Alert & oriented x 3, no focal neuro deficits Psychiatric: Speech and behavior appropriate   ED Course   Medications - No data to display  No orders of the defined types  were placed in this encounter.   No results found for this or any previous visit (from the past 24 hour(s)). No results found.  ED Clinical Impression  1. Normal ear exam   2. Excessive cerumen in both ear canals      ED Assessment/Plan  After manipulation of the left ear, patient states that his symptoms have completely resolved.  Both ears were curetted out by me.  He has bilateral normal ear exam.  Will send home with Debrox once a week to prevent repeated earwax buildup.  Follow-up with PMD as needed.  No orders of the defined types were placed  in this encounter.   *This clinic note was created using Dragon dictation software. Therefore, there may be occasional mistakes despite careful proofreading.   ?    Melynda Ripple, MD 04/29/19 1729

## 2019-05-05 ENCOUNTER — Ambulatory Visit
Admission: EM | Admit: 2019-05-05 | Discharge: 2019-05-05 | Disposition: A | Discharge status: Home / Self Care | Payer: Medicare Other | Attending: Emergency Medicine | Admitting: Emergency Medicine

## 2019-05-05 ENCOUNTER — Other Ambulatory Visit: Payer: Self-pay

## 2019-05-05 DIAGNOSIS — N390 Urinary tract infection, site not specified: Secondary | ICD-10-CM | POA: Insufficient documentation

## 2019-05-05 DIAGNOSIS — K4090 Unilateral inguinal hernia, without obstruction or gangrene, not specified as recurrent: Secondary | ICD-10-CM

## 2019-05-05 DIAGNOSIS — N289 Disorder of kidney and ureter, unspecified: Secondary | ICD-10-CM | POA: Diagnosis present

## 2019-05-05 LAB — URINALYSIS, COMPLETE (UACMP) WITH MICROSCOPIC
Bilirubin Urine: NEGATIVE
Glucose, UA: NEGATIVE mg/dL
Ketones, ur: NEGATIVE mg/dL
Nitrite: NEGATIVE
Protein, ur: 30 mg/dL — AB
Specific Gravity, Urine: 1.02 (ref 1.005–1.030)
pH: 6 (ref 5.0–8.0)

## 2019-05-05 LAB — CBC WITH DIFFERENTIAL/PLATELET
Abs Immature Granulocytes: 0.02 10*3/uL (ref 0.00–0.07)
Basophils Absolute: 0.1 10*3/uL (ref 0.0–0.1)
Basophils Relative: 1 %
Eosinophils Absolute: 0.2 10*3/uL (ref 0.0–0.5)
Eosinophils Relative: 2 %
HCT: 38.7 % — ABNORMAL LOW (ref 39.0–52.0)
Hemoglobin: 12.3 g/dL — ABNORMAL LOW (ref 13.0–17.0)
Immature Granulocytes: 0 %
Lymphocytes Relative: 16 %
Lymphs Abs: 1.6 10*3/uL (ref 0.7–4.0)
MCH: 29.5 pg (ref 26.0–34.0)
MCHC: 31.8 g/dL (ref 30.0–36.0)
MCV: 92.8 fL (ref 80.0–100.0)
Monocytes Absolute: 0.7 10*3/uL (ref 0.1–1.0)
Monocytes Relative: 7 %
Neutro Abs: 7.4 10*3/uL (ref 1.7–7.7)
Neutrophils Relative %: 74 %
Platelets: 191 10*3/uL (ref 150–400)
RBC: 4.17 MIL/uL — ABNORMAL LOW (ref 4.22–5.81)
RDW: 14.3 % (ref 11.5–15.5)
WBC: 9.9 10*3/uL (ref 4.0–10.5)
nRBC: 0 % (ref 0.0–0.2)

## 2019-05-05 LAB — COMPREHENSIVE METABOLIC PANEL
ALT: 12 U/L (ref 0–44)
AST: 20 U/L (ref 15–41)
Albumin: 4.5 g/dL (ref 3.5–5.0)
Alkaline Phosphatase: 65 U/L (ref 38–126)
Anion gap: 11 (ref 5–15)
BUN: 37 mg/dL — ABNORMAL HIGH (ref 8–23)
CO2: 27 mmol/L (ref 22–32)
Calcium: 9.2 mg/dL (ref 8.9–10.3)
Chloride: 103 mmol/L (ref 98–111)
Creatinine, Ser: 1.61 mg/dL — ABNORMAL HIGH (ref 0.61–1.24)
GFR calc Af Amer: 47 mL/min — ABNORMAL LOW (ref >60)
GFR calc non Af Amer: 41 mL/min — ABNORMAL LOW (ref >60)
Glucose, Bld: 98 mg/dL (ref 70–99)
Potassium: 5.3 mmol/L — ABNORMAL HIGH (ref 3.5–5.1)
Sodium: 141 mmol/L (ref 135–145)
Total Bilirubin: 1.5 mg/dL — ABNORMAL HIGH (ref 0.3–1.2)
Total Protein: 8.1 g/dL (ref 6.5–8.1)

## 2019-05-05 MED ORDER — CEFDINIR 300 MG PO CAPS: 300.0000 mg | ORAL_CAPSULE | Freq: Every day | ORAL | 0 refills | Status: AC

## 2019-05-05 MED ORDER — TAMSULOSIN HCL 0.4 MG PO CAPS: 0.4000 mg | ORAL_CAPSULE | Freq: Every day | ORAL | 0 refills | Status: AC

## 2019-05-05 NOTE — ED Triage Notes (Addendum)
Pt presents with c/o LBP and abdominal pain that started last week. He does report pain with urination and increased urgency/frequency. He denies burning, decreased flow or hematuria. He reports a history of kidney stones. He does have a urologist and they recommended he be evaluated.

## 2019-05-05 NOTE — Discharge Instructions (Addendum)
You have some new renal insufficiency.  You need to start drinking more fluids.  Your BUN is 37 and creatinine is 1.61.  You May not be able to get a CT with IV contrast with kidney function like this.  Talk to your urologist ASAP.  You also cannot take any ibuprofen unfortunately.  I suspect that the antibiotics will significantly help with your pain.  You can continue Tylenol, as long as you do not exceed 4000 mg in 24-hour period of time.  Go immediately to the ER for fevers above 100.4, if your abdominal pain changes, gets worse, vomiting, nausea, body aches, or for any other concerns.

## 2019-05-05 NOTE — ED Provider Notes (Signed)
HPI  SUBJECTIVE:  Seth Woodard is a 79 y.o. male who presents with right flank pain described as dull, pressure-like that occurred daily all last week.  It resolved this morning.  He has the same pain now but it is located in the suprapubic region.  States it is constant.  Does not migrate, does not radiate.  No nausea no vomiting, fevers, chest pain, shortness of breath.  No known new or different back pain.  Reports dysuria, frequency.  No urinary urgency, cloudy odorous urine, hematuria.  No testicular pain or swelling.  He reports some burning perineal pain, but no swelling.  States he ran out of Flomax 1 week ago.  He states that he has been stooling small amounts every several hours for the past 1 to 2 months.  It has not changed today.  No unintentional weight loss.  He denies melena, hematochezia.  No anorexia.  He has been taking 1000 milligrams of Tylenol every 6 hours, but states that he was taking it more frequently last night.  He denies taking more than 4 g of Tylenol in the past 24 hours.  He states that the Tylenol helps.  Symptoms seem to be worse with eating.  Is not associated with urination, defecation, movement.  States that the car ride over here was not painful.  He has had similar symptoms before, when he was found to have nonobstructing nephrolithiasis.  He has a past medical history of BPH, UTI, nonobstructing nephrolithiasis, scoliosis L1-L3, pancreatitis secondary to cholelithiasis.  He is status post appendectomy.  He has renal cell carcinoma and is scheduled for an abdominal CT on Friday to evaluate this renal mass and for a stone per the patient's understanding.  No history of aortic abdominal aneurysm, pyelonephritis, chronic kidney disease or prostatitis, epididymitis, diverticulitis, diverticulosis.  PMD: Dr. Army Melia.  Urology: Dr. Cira Servant  Past Medical History:  Diagnosis Date  . Heart murmur   . 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  Hyperlipidemia   . Hypertension     Past Surgical History:  Procedure Laterality Date  . APPENDECTOMY  1967  . CATARACT EXTRACTION W/PHACO Right 06/18/2017   Procedure: CATARACT EXTRACTION PHACO AND INTRAOCULAR LENS PLACEMENT ([place]) RIGHT;  Surgeon: Leandrew Koyanagi, MD;  Location: [facility];  Service: Ophthalmology;  Laterality: Right;  requests to be last  . 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 WISDOM TOOTH EXTRACTION      History reviewed. No pertinent family history.  Social History   Tobacco Use  . Smoking status: Never Smoker  . Smokeless tobacco: Never Used  Substance Use Topics  . Alcohol use: No  . Drug use: No    No current facility-administered medications for this encounter.  Current Outpatient Medications:  .  atorvastatin (LIPITOR) 80 MG tablet, , Disp: , Rfl:  .  bisoprolol (ZEBETA) 5 MG tablet, Take by mouth., Disp: , Rfl:  .  Calcium Carbonate-Vitamin D (RA CALCIUM PLUS VITAMIN D) 600-400 MG-UNIT tablet, Take 1 tablet by mouth daily., Disp: , Rfl:  .  vitamin C (ASCORBIC ACID) 500 MG tablet, Take 500 mg by mouth daily., Disp: , Rfl:  .  cefdinir (OMNICEF) 300 MG capsule, Take 1 capsule (300 mg total) by mouth daily for 10 days., Disp: 10 capsule, Rfl: 0 .  tamsulosin (FLOMAX) 0.4 MG CAPS capsule, Take 1 capsule (0.4 mg total) by mouth at bedtime for 7 days., Disp:  7 capsule, Rfl: 0  No Known Allergies   ROS  As noted in HPI.   Physical Exam  BP 131/77 (BP Location: Right Arm)   Pulse 69   Temp 98.2 F (36.8 C) (Oral)   Ht 5\' 7"  (1.702 m)   Wt 54.4 kg   SpO2 98%   BMI 18.79 kg/m   Constitutional: Well developed, well nourished, no acute distress Eyes: PERRL, EOMI, conjunctiva normal bilaterally HENT: Normocephalic, atraumatic,mucus membranes moist Respiratory: Clear to auscultation bilaterally, no rales, no wheezing, no rhonchi Cardiovascular: Normal rate and rhythm, no murmurs, no gallops, no rubs GI: Normal  appearance, soft, nondistended, normal bowel sounds, positive suprapubic tenderness, no rebound, no guarding.  Negative Murphy.  No flank tenderness.  Positive nontender left sided reducible inguinal hernia Back: no CVAT GU: Normal circumcised male, testes descended bilaterally.  No testicular epididymal tenderness.  No tenderness over the perineum.  No penile rash, discharge.  Patient declined chaperone. Rectal: Normal rectal tone, firm, smooth, enlarged nontender prostate.  No bogginess. skin: No rash, skin intact Musculoskeletal: No edema, no tenderness, no deformities Neurologic: Alert & oriented x 3, CN III-XII grossly intact, no motor deficits, sensation grossly intact Psychiatric: Speech and behavior appropriate   ED Course   Medications - No data to display  Orders Placed This Encounter  Procedures  . Urine culture    Standing Status:   Standing    Number of Occurrences:   1    Order Specific Question:   List patient's active antibiotics    Answer:   [email or web address]  . Urinalysis, Complete w Microscopic    Standing Status:   Standing    Number of Occurrences:   1  . CBC with Differential    Standing Status:   Standing    Number of Occurrences:   1  . Comprehensive metabolic panel    Standing Status:   Standing    Number of Occurrences:   1   Results for orders placed or performed during the hospital encounter of 05/05/19 (from the past 24 hour(s))  Urinalysis, Complete w Microscopic     Status: Abnormal   Collection Time: 05/05/19  5:34 PM  Result Value Ref Range   Color, Urine YELLOW YELLOW   APPearance CLOUDY (A) CLEAR   Specific Gravity, Urine 1.020 1.005 - 1.030   pH 6.0 5.0 - 8.0   Glucose, UA NEGATIVE NEGATIVE mg/dL   Hgb urine dipstick MODERATE (A) NEGATIVE   Bilirubin Urine NEGATIVE NEGATIVE   Ketones, ur NEGATIVE NEGATIVE mg/dL   Protein, ur 30 (A) NEGATIVE mg/dL   Nitrite NEGATIVE NEGATIVE   Leukocytes,Ua LARGE (A) NEGATIVE   Squamous Epithelial / LPF 0-5 0 -  5   WBC, UA TOO NUMEROUS TO COUNT 0 - 5 WBC/hpf   RBC / HPF 11-20 0 - 5 RBC/hpf   Bacteria, UA MANY (A) NONE SEEN  CBC with Differential     Status: Abnormal   Collection Time: 05/05/19  6:34 PM  Result Value Ref Range   WBC 9.9 4.0 - 10.5 K/uL   RBC 4.17 (L) 4.22 - 5.81 MIL/uL   Hemoglobin 12.3 (L) 13.0 - 17.0 g/dL   HCT 38.7 (L) 39.0 - 52.0 %   MCV 92.8 80.0 - 100.0 fL   MCH 29.5 26.0 - 34.0 pg   MCHC 31.8 30.0 - 36.0 g/dL   RDW 14.3 11.5 - 15.5 %   Platelets 191 150 - 400 K/uL   nRBC 0.0 0.0 - 0.2 %  Neutrophils Relative % 74 %   Neutro Abs 7.4 1.7 - 7.7 K/uL   Lymphocytes Relative 16 %   Lymphs Abs 1.6 0.7 - 4.0 K/uL   Monocytes Relative 7 %   Monocytes Absolute 0.7 0.1 - 1.0 K/uL   Eosinophils Relative 2 %   Eosinophils Absolute 0.2 0.0 - 0.5 K/uL   Basophils Relative 1 %   Basophils Absolute 0.1 0.0 - 0.1 K/uL   Immature Granulocytes 0 %   Abs Immature Granulocytes 0.02 0.00 - 0.07 K/uL  Comprehensive metabolic panel     Status: Abnormal   Collection Time: 05/05/19  6:34 PM  Result Value Ref Range   Sodium 141 135 - 145 mmol/L   Potassium 5.3 (H) 3.5 - 5.1 mmol/L   Chloride 103 98 - 111 mmol/L   CO2 27 22 - 32 mmol/L   Glucose, Bld 98 70 - 99 mg/dL   BUN 37 (H) 8 - 23 mg/dL   Creatinine, Ser 1.61 (H) 0.61 - 1.24 mg/dL   Calcium 9.2 8.9 - 10.3 mg/dL   Total Protein 8.1 6.5 - 8.1 g/dL   Albumin 4.5 3.5 - 5.0 g/dL   AST 20 15 - 41 U/L   ALT 12 0 - 44 U/L   Alkaline Phosphatase 65 38 - 126 U/L   Total Bilirubin 1.5 (H) 0.3 - 1.2 mg/dL   GFR calc non Af Amer 41 (L) >60 mL/min   GFR calc Af Amer 47 (L) >60 mL/min   Anion gap 11 5 - 15   No results found.  ED Clinical Impression  1. Complicated UTI (urinary tract infection)   2. Renal insufficiency   3. Non-recurrent unilateral inguinal hernia without obstruction or gangrene      ED Assessment/Plan   Patient with suprapubic tenderness.  No flank tenderness, no other abdominal tenderness.  No CVAT.  This  appears to be a UTI.  No evidence of prostatitis.  UA consistent with UTI with many bacteria, large leukocytes, pyuria and moderate hematuria.  Could be an infected stone, however he has had never had an obstructing stone, so we will restart Flomax,  Omnicef for 10 days. sending urine off for culture to confirm antibiotic choice and diagnosis.  Checking CBC and CMP for kidney and liver function.  He also has a left inguinal hernia.  Will refer to Dr. Celine Ahr, general surgery on-call for elective evaluation,.  Patient with renal insufficiency.  Calculated creatinine clearance 29.57 mL/min  He has mild hyperkalemia, but do not think this is clinically significant.  He has some renal insufficiency which is new.  Hemoglobin is at baseline.  LFTs are normal.  Omnicef renal dosing 300 mg every 24 hours.  No Pyridium ibuprofen due to creatinine clearance.  He has follow-up on Friday for a CT.  Plan as above.  Sending home with strict ER return precautions.  Discussed labs,MDM, treatment plan, and plan for follow-up with patient Discussed sn/sx that should prompt return to the ED. patient agrees with plan.   Meds ordered this encounter  Medications  . cefdinir (OMNICEF) 300 MG capsule    Sig: Take 1 capsule (300 mg total) by mouth daily for 10 days.    Dispense:  10 capsule    Refill:  0  . tamsulosin (FLOMAX) 0.4 MG CAPS capsule    Sig: Take 1 capsule (0.4 mg total) by mouth at bedtime for 7 days.    Dispense:  7 capsule    Refill:  0    *  This clinic note was created using [employer]. Therefore, there may be occasional mistakes despite careful proofreading.  ?    Melynda Ripple, MD 05/05/19 1955

## 2019-05-09 ENCOUNTER — Telehealth (HOSPITAL_COMMUNITY): Payer: Self-pay | Admitting: Emergency Medicine

## 2019-05-09 LAB — URINE CULTURE: Culture: >=100000 — AB

## 2019-05-09 NOTE — Telephone Encounter (Signed)
Called pt regarding his urine culture and symptoms. Pt states he is much much better and will follow up with his PCP if anything changes. All questions answered.

## 2019-05-20 HISTORY — PX: RENAL CRYOABLATION: SHX2322

## 2020-03-15 LAB — COMPREHENSIVE METABOLIC PANEL
Albumin: 4.2 (ref 3.5–5.0)
Calcium: 9 (ref 8.7–10.7)

## 2020-03-15 LAB — BASIC METABOLIC PANEL
BUN: 25 — AB (ref 4–21)
CO2: 29 — AB (ref 13–22)
Chloride: 101 (ref 99–108)
Creatinine: 1.1 (ref 0.6–1.3)
Glucose: 80
Potassium: 5 (ref 3.4–5.3)
Sodium: 139 (ref 137–147)

## 2020-03-15 LAB — CBC AND DIFFERENTIAL
HCT: 37 — AB (ref 41–53)
Hemoglobin: 11.7 — AB (ref 13.5–17.5)
Platelets: 202 (ref 150–399)
WBC: 14.3

## 2020-05-07 ENCOUNTER — Telehealth: Payer: Self-pay

## 2020-05-11 ENCOUNTER — Ambulatory Visit: Payer: Medicare Other | Admitting: Internal Medicine

## 2020-05-20 ENCOUNTER — Other Ambulatory Visit: Payer: Self-pay

## 2020-05-20 ENCOUNTER — Ambulatory Visit
Admission: RE | Admit: 2020-05-20 | Discharge: 2020-05-20 | Disposition: A | Discharge status: Home / Self Care | Payer: Medicare Other | Source: Ambulatory Visit | Attending: Otolaryngology | Admitting: Otolaryngology

## 2020-05-20 ENCOUNTER — Other Ambulatory Visit: Payer: Self-pay | Admitting: Otolaryngology

## 2020-05-20 DIAGNOSIS — R051 Acute cough: Secondary | ICD-10-CM

## 2020-05-26 ENCOUNTER — Ambulatory Visit (INDEPENDENT_AMBULATORY_CARE_PROVIDER_SITE_OTHER): Payer: Medicare Other | Admitting: Internal Medicine

## 2020-05-26 ENCOUNTER — Encounter: Payer: Self-pay | Admitting: Internal Medicine

## 2020-05-26 ENCOUNTER — Other Ambulatory Visit: Payer: Self-pay

## 2020-05-26 VITALS — BP 112/62 | HR 68 | Temp 97.9°F | Ht 67.0 in | Wt 121.0 lb

## 2020-05-26 DIAGNOSIS — R059 Cough, unspecified: Secondary | ICD-10-CM | POA: Diagnosis not present

## 2020-05-26 DIAGNOSIS — D519 Vitamin B12 deficiency anemia, unspecified: Secondary | ICD-10-CM

## 2020-05-26 DIAGNOSIS — J948 Other specified pleural conditions: Secondary | ICD-10-CM | POA: Diagnosis not present

## 2020-05-26 DIAGNOSIS — D649 Anemia, unspecified: Secondary | ICD-10-CM

## 2020-05-26 MED ORDER — OMEPRAZOLE 40 MG PO CPDR: 40.0000 mg | DELAYED_RELEASE_CAPSULE | Freq: Every day | ORAL | 3 refills | Status: DC

## 2020-05-26 NOTE — Progress Notes (Signed)
Date:  05/26/2020   Name:  Seth Woodard   DOB:  Mar 09, 1941   MRN:  [id number]   Chief Complaint: Cough (X1 month, no mucous, SOB)  Cough This is a chronic problem. The current episode started more than 1 month ago. The problem has been unchanged. The cough is non-productive. Pertinent negatives include no chest pain, chills, fever, headaches, heartburn, shortness of breath or wheezing. The symptoms are aggravated by lying down (exhaling). He has tried nothing for the symptoms.  ENT saw him and recommended Flonase and singular which he did not start.  CXR was ordered for him to follow up with me.  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  No smoking exposure either first or second hand Last covid test was negative about 2 weeks ago at [facility]. Anemia - he had a Hgb of 10 several days ago.  He is wondering about iron levels or B12.  Lab Results  Component Value Date   CREATININE 1.1 03/15/2020   BUN 25 (A) 03/15/2020   NA 139 03/15/2020   K 5.0 03/15/2020   CL 101 03/15/2020   CO2 29 (A) 03/15/2020   Lab Results  Component Value Date   CHOL 121 04/09/2017   HDL 57 04/09/2017   LDLCALC 55 04/09/2017   TRIG 45 04/09/2017   CHOLHDL 2.1 04/09/2017   Lab Results  Component Value Date   TSH 1.407 04/09/2017   No results found for: HGBA1C Lab Results  Component Value Date   WBC 14.3 03/15/2020   HGB 11.7 (A) 03/15/2020   HCT 37 (A) 03/15/2020   MCV 92.8 05/05/2019   PLT 202 03/15/2020   Lab Results  Component Value Date   ALT 12 05/05/2019   AST 20 05/05/2019   ALKPHOS 65 05/05/2019   BILITOT 1.5 (H) 05/05/2019     Review of Systems  Constitutional: Negative for chills, fatigue, fever and unexpected weight change.  Respiratory: Positive for cough. Negative for chest tightness, shortness of breath and wheezing.   Cardiovascular: Negative for chest pain.  Gastrointestinal: Negative for heartburn.  Genitourinary: Positive  for difficulty urinating (has indwelling foley until prostate surgery in a few weeks).  Neurological: Negative for dizziness, light-headedness and headaches.    Patient Active Problem List   Diagnosis Date Noted  . 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 Renal cell cancer, left ([place]) 03/07/2018  . Hydronephrosis 03/07/2018  . History of four vessel coronary artery bypass graft 04/11/2017  . CAD (coronary artery disease) 04/09/2017  . Hypertension 04/09/2017  . Elevated PSA 04/09/2017  . 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 Hyperlipidemia 04/09/2017  . History of kidney stones 04/09/2017  . Anemia, normocytic normochromic 04/09/2017    No Known Allergies  Past Surgical History:  Procedure Laterality Date  . APPENDECTOMY  1967  . CATARACT EXTRACTION W/PHACO Right 06/18/2017   Procedure: CATARACT EXTRACTION PHACO AND INTRAOCULAR LENS PLACEMENT ([place]) RIGHT;  Surgeon: Leandrew Koyanagi, MD;  Location: [facility];  Service: Ophthalmology;  Laterality: Right;  requests to be last  . 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 WISDOM TOOTH EXTRACTION      Social History   Tobacco Use  . Smoking status: Never Smoker  . Smokeless tobacco: Never Used  Vaping Use  . Vaping Use: Never used  Substance Use Topics  . Alcohol use: No  . Drug use: No  Medication list has been reviewed and updated.  Current Meds  Medication Sig  . atorvastatin (LIPITOR) 80 MG tablet   . bisoprolol (ZEBETA) 5 MG tablet Take 2.5 mg by mouth.  . Calcium Carbonate-Vitamin D 600-400 MG-UNIT tablet Take 1 tablet by mouth daily.  . fluticasone (FLONASE) 50 MCG/ACT nasal spray Place into both nostrils.  . montelukast (SINGULAIR) 10 MG tablet Take 10 mg by mouth daily.  . tamsulosin (FLOMAX) 0.4 MG CAPS capsule Take by mouth.  . vitamin C (ASCORBIC ACID) 500 MG tablet Take 500 mg by mouth daily.    PHQ 2/9 Scores 05/26/2020 09/25/2018 04/09/2017  PHQ - 2 Score 0 0  0  PHQ- 9 Score 0 - -    GAD 7 : Generalized Anxiety Score 05/26/2020  Nervous, Anxious, on Edge 0  Control/stop worrying 0  Worry too much - different things 0  Trouble relaxing 0  Restless 0  Easily annoyed or irritable 0  Afraid - awful might happen 0  Total GAD 7 Score 0    BP Readings from Last 3 Encounters:  05/26/20 112/62  05/05/19 131/77  04/29/19 123/60    Physical Exam Vitals and nursing note reviewed.  Constitutional:      General: He is not in acute distress.    Appearance: Normal appearance. He is well-developed.  HENT:     Head: Normocephalic and atraumatic.  Cardiovascular:     Rate and Rhythm: Normal rate and regular rhythm.  No extrasystoles are present.    Pulses: Normal pulses.     Heart sounds: Murmur heard.   Systolic murmur is present with a grade of 2/6.   Pulmonary:     Effort: Pulmonary effort is normal. No respiratory distress.     Breath sounds: No wheezing or rhonchi.  Musculoskeletal:        General: Normal range of motion.     Cervical back: Normal range of motion and neck supple.     Right lower leg: No edema.     Left lower leg: No edema.  Skin:    General: Skin is warm and dry.     Findings: No rash.  Neurological:     General: No focal deficit present.     Mental Status: He is alert and oriented to person, place, and time.  Psychiatric:        Mood and Affect: Mood and affect normal.        Behavior: Behavior normal.        Thought Content: Thought content normal.     Wt Readings from Last 3 Encounters:  05/26/20 121 lb (54.9 kg)  05/05/19 120 lb (54.4 kg)  04/29/19 120 lb (54.4 kg)    BP 112/62   Pulse 68   Temp 97.9 F (36.6 C) (Oral)   Ht 5\' 7"  (1.702 m)   Wt 121 lb (54.9 kg)   SpO2 95%   BMI 18.95 kg/m   Assessment and Plan: 1. 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 Cough in adult DDX: LPR, allergic PND, asthma/copd (less likely) Begin Flonase NS daily, Singulair at HS, PPI - Ambulatory referral to Pulmonology - omeprazole (PRILOSEC) 40 MG capsule; Take 1 capsule (40 mg total) by mouth daily.  Dispense: 30 capsule; Refill: 3  3. Anemia, unspecified type Recent Hgb 10 with elevated RDW  and normal [place] Rule out mixed iron and B12 def. - Iron, TIBC and Ferritin Panel  4. Anemia due to vitamin B12 deficiency, unspecified B12 deficiency type - Vitamin B12   Partially dictated using Editor, commissioning. Any errors are unintentional.  Halina Maidens, MD [facility]  05/26/2020

## 2020-05-27 LAB — IRON,TIBC AND FERRITIN PANEL
Ferritin: 201 ng/mL (ref 30–400)
Iron Saturation: >81 % (ref 15–55)
Iron: 74 ug/dL (ref 38–169)
Total Iron Binding Capacity: <91 ug/dL — CL (ref 250–450)
UIBC: <17 ug/dL — ABNORMAL LOW (ref 111–343)

## 2020-05-27 LAB — VITAMIN B12: Vitamin B-12: 271 pg/mL (ref 232–1245)

## 2020-06-22 HISTORY — PX: OTHER SURGICAL HISTORY: SHX169

## 2020-07-15 ENCOUNTER — Telehealth: Payer: Self-pay

## 2020-07-15 NOTE — Telephone Encounter (Signed)
Called patient per Dr. Army Melia and spoke about scheduled appt Monday. Informed him that because he had surgery 8-10 days ago and lost blood from it, he is still going to be anemic this Monday and its too soon to do a lab recheck.   He said he understands that. His main concern for the appt Monday is he wants to discuss his levels BEFORE surgery and what they mean. I did explain to patient that based off his labs prior to surgery he was making too much iron and he was to stop taking iron supplements.   He said he did but still wants to discuss his concerns with Doctor personally. Told him we can just keep his appt and Dr Army Melia most likely will not recheck labs since its too soon after surgery, but they can discuss his previous labs.  Routing to Dr. Army Melia as FYI for appt Monday.

## 2020-07-19 ENCOUNTER — Ambulatory Visit (INDEPENDENT_AMBULATORY_CARE_PROVIDER_SITE_OTHER): Payer: Medicare Other | Admitting: Internal Medicine

## 2020-07-19 ENCOUNTER — Encounter: Payer: Self-pay | Admitting: Internal Medicine

## 2020-07-19 ENCOUNTER — Ambulatory Visit: Payer: Medicare Other | Admitting: Internal Medicine

## 2020-07-19 ENCOUNTER — Other Ambulatory Visit: Payer: Self-pay

## 2020-07-19 VITALS — BP 134/64 | HR 60 | Temp 97.7°F | Ht 67.0 in | Wt 116.0 lb

## 2020-07-19 DIAGNOSIS — D649 Anemia, unspecified: Secondary | ICD-10-CM | POA: Diagnosis not present

## 2020-07-19 NOTE — Progress Notes (Signed)
Date:  07/19/2020   Name:  Seth Woodard   DOB:  Jan 29, 1941   MRN:  [id number]   Chief Complaint: Anemia (Patient complaining of fingers and toes cold and wants to DISCUSS CBC labs and anemia before surgery.)  Anemia Presents for follow-up visit. There has been no abdominal pain, fever, leg swelling, light-headedness, palpitations or weight loss. (Had low Hct in January - labs showed high iron sat and low TIBC. He was told to stop iron and repeat in 3 months. He then had a urological procedure with bleeding and was transfused in the interim.)  He also had a known renal mass s/p cryoablation. He complains of low energy, decreased appetite and cold fingers and toes.  He does not like Boost or Ensure supplements.  Hx follows: Detailed urological history  02/24/20 - ONE TEAM ineligible (renal, BPH) 05/10/2018: prostate MRI. BPH. No dominant lesion 05/09/2019: CT RCC protocol - unchanged 2.1 cm renal mass. Diverticula 05/20/2019: cryo of left renal tumor 12/16/2019:CT rcc protocol - no recurrence or mets 05/20/2020: OSH chest x-ray (pt brought report) - no evidence of metastatic disease  Most recent CBC 07/16/20: WBC (White Blood Cell Count) 3.2 - 9.8 x10^9/L 5.3    Hemoglobin 13.7 - 17.3 g/dL 8.6Low    Hematocrit 39.0 - 49.0 % 27.3Low    Platelets 150 - 450 x10^9/L 115Low      Lab Results  Component Value Date   IRON 74 05/26/2020   TIBC <91 (LL) 05/26/2020   FERRITIN 201 05/26/2020  Iron saturation >81 ^^  Lab Results  Component Value Date   CREATININE 1.1 03/15/2020   BUN 25 (A) 03/15/2020   NA 139 03/15/2020   K 5.0 03/15/2020   CL 101 03/15/2020   CO2 29 (A) 03/15/2020   Lab Results  Component Value Date   CHOL 121 04/09/2017   HDL 57 04/09/2017   LDLCALC 55 04/09/2017   TRIG 45 04/09/2017   CHOLHDL 2.1 04/09/2017   Lab Results  Component Value Date   TSH 1.407 04/09/2017   No results found for: HGBA1C Lab Results  Component Value Date   WBC 14.3  03/15/2020   HGB 11.7 (A) 03/15/2020   HCT 37 (A) 03/15/2020   MCV 92.8 05/05/2019   PLT 202 03/15/2020   Lab Results  Component Value Date   ALT 12 05/05/2019   AST 20 05/05/2019   ALKPHOS 65 05/05/2019   BILITOT 1.5 (H) 05/05/2019     Review of Systems  Constitutional: Positive for fatigue. Negative for chills, fever, unexpected weight change and weight loss.  Respiratory: Negative for chest tightness, shortness of breath and wheezing.   Cardiovascular: Negative for chest pain, palpitations and leg swelling.       Cool fingers and toes  Gastrointestinal: Negative for abdominal pain and blood in stool.  Genitourinary: Negative for dysuria and hematuria.  Neurological: Negative for dizziness, light-headedness and headaches.  Psychiatric/Behavioral: Negative for dysphoric mood and sleep disturbance. The patient is not nervous/anxious.     Patient Active Problem List   Diagnosis Date Noted  . 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 Cough in adult 05/26/2020  . Hydronephrosis 03/07/2018  . History of four vessel coronary artery bypass graft 04/11/2017  . CAD (coronary artery disease) 04/09/2017  . Hypertension 04/09/2017  . Elevated PSA 04/09/2017  . 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 Hyperlipidemia 04/09/2017  . History of kidney stones 04/09/2017  . Anemia, normocytic normochromic 04/09/2017  No Known Allergies  Past Surgical History:  Procedure Laterality Date  . APPENDECTOMY  1967  . CATARACT EXTRACTION W/PHACO Right 06/18/2017   Procedure: CATARACT EXTRACTION PHACO AND INTRAOCULAR LENS PLACEMENT ([place]) RIGHT;  Surgeon: Leandrew Koyanagi, MD;  Location: [facility];  Service: Ophthalmology;  Laterality: Right;  requests to be last  . 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 Holmium laser of Prostate  06/2020  . RENAL CRYOABLATION Left 05/20/2019  . WISDOM TOOTH EXTRACTION      Social History   Tobacco Use  .  Smoking status: Never Smoker  . Smokeless tobacco: Never Used  Vaping Use  . Vaping Use: Never used  Substance Use Topics  . Alcohol use: No  . Drug use: No     Medication list has been reviewed and updated.  Current Meds  Medication Sig  . atorvastatin (LIPITOR) 80 MG tablet   . bisoprolol (ZEBETA) 5 MG tablet Take 2.5 mg by mouth.  . Calcium Carbonate-Vitamin D 600-400 MG-UNIT tablet Take 1 tablet by mouth daily.  . fluticasone (FLONASE) 50 MCG/ACT nasal spray Place into both nostrils.  [place] Kitchen omeprazole (PRILOSEC) 40 MG capsule Take 1 capsule (40 mg total) by mouth daily.  . vitamin C (ASCORBIC ACID) 500 MG tablet Take 500 mg by mouth daily.    PHQ 2/9 Scores 07/19/2020 05/26/2020 09/25/2018 04/09/2017  PHQ - 2 Score 0 0 0 0  PHQ- 9 Score 0 0 - -    GAD 7 : Generalized Anxiety Score 07/19/2020 05/26/2020  Nervous, Anxious, on Edge 0 0  Control/stop worrying 0 0  Worry too much - different things 0 0  Trouble relaxing 0 0  Restless 0 0  Easily annoyed or irritable 0 0  Afraid - awful might happen 0 0  Total GAD 7 Score 0 0  Anxiety Difficulty Not difficult at all -    BP Readings from Last 3 Encounters:  07/19/20 134/64  05/26/20 112/62  05/05/19 131/77    Physical Exam Vitals and nursing note reviewed.  Constitutional:      General: He is not in acute distress.    Appearance: He is well-developed.  HENT:     Head: Normocephalic and atraumatic.  Cardiovascular:     Rate and Rhythm: Normal rate and regular rhythm.     Pulses: Normal pulses.  Pulmonary:     Effort: Pulmonary effort is normal. No respiratory distress.     Breath sounds: No wheezing or rhonchi.  Musculoskeletal:     Cervical back: Normal range of motion.     Right lower leg: No edema.     Left lower leg: No edema.  Lymphadenopathy:     Cervical: No cervical adenopathy.  Skin:    General: Skin is warm and dry.     Capillary Refill: Capillary refill takes less than 2 seconds. Fingers cool without  cyanosis or blanching    Findings: No rash.  Neurological:     Mental Status: He is alert and oriented to person, place, and time.  Psychiatric:        Mood and Affect: Mood normal.        Behavior: Behavior normal.     Wt Readings from Last 3 Encounters:  07/19/20 116 lb (52.6 kg)  05/26/20 121 lb (54.9 kg)  05/05/19 120 lb (54.4 kg)    BP 134/64   Pulse 60   Temp 97.7 F (36.5 C) (Oral)   Ht 5\' 7"  (1.702  m)   Wt 116 lb (52.6 kg)   SpO2 100%   BMI 18.17 kg/m   Assessment and Plan: 1. Anemia, unspecified type With some cold intolerance - keep hands warm in gloves; wear shoes and socks Anemia profile confusing with high MCV, low HCT, high iron stores, low-normal B12 and he is s/p transfusion 10 days ago. Hbg improved from immediate post op values Recommend no iron or B12 supplements; work on balanced diet with supplements such as [employer] instant breakfast - Ambulatory referral to Hematology   Partially dictated using Editor, commissioning. Any errors are unintentional.  Halina Maidens, MD [facility]  07/19/2020

## 2020-07-20 ENCOUNTER — Telehealth: Payer: Self-pay | Admitting: Internal Medicine

## 2020-07-20 NOTE — Telephone Encounter (Signed)
I think he will be okay with that low level of iron.  Iron will be likely be part of most supplements.

## 2020-07-20 NOTE — Telephone Encounter (Signed)
Patient informed that this is okay since its a low level of iron.

## 2020-07-20 NOTE — Telephone Encounter (Signed)
Pt was seen yesterday concerning his iron. Pt was told to get carnation instant breakfast to help with weight gain the iron level 3.6 grams which is 20% of daily requirement. Pt is calling to see if carnation instant breakfast is the right thing to get

## 2020-07-21 NOTE — Progress Notes (Signed)
[facility]  [address] [place], [place] [address] Phone: [phone]  Fax: [phone]   Clinic Day:  07/22/2020  Referring physician: Glean Hess, MD  Chief Complaint: Seth Woodard is a 80 y.o. male with anemia who is referred in consultation by Dr. Halina Maidens for assessment and management.   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 He received 1 unit PRBCs the next day for a hemoglobin of 6.8; hemoglobin increased to 7.2. There was low suspicion for acute blood loss anemia and more concern for chronic anemia of unknown origin.  The patient saw Dr. Army Melia on 07/19/2020. He reported cold intolerance, low energy, and decreased appetite. CBC on 07/16/2020 revealed a hematocrit was 27.3, hemoglobin 8.6, MCV 108.0, platelets 115,000, WBC 5,300. He was referred to hematology.  Labs followed: 08/21/2012: Hematocrit 31.5, hemoglobin 10.0, MCV   86.0, platelets 429,000, WBC 12,100. 04/09/2017: Hematocrit 37.5, hemoglobin 12.4, MCV   88.8, platelets 217,000, WBC   8,100. 04/30/2018: Hematocrit 40.4, hemoglobin 12.4, MCV   96.0, platelets 213,000, WBC   7,900. 05/05/2019: Hematocrit 38.7, hemoglobin 12.3, MCV   92.8, platelets 191,000, WBC   9,900. 03/15/2020: Hematocrit 37.0, hemoglobin 11.7, MCV   -----,  platelets 202,000, WBC 14,300. 05/25/2020: Hematocrit 32.0, hemoglobin 10.1, MCV   98.0, platelets 157,000, WBC   4,800. 07/07/2020: Hematocrit 20.9, hemoglobin   6.8, MCV 103.0, platelets   93,000, WBC   4,000. 07/16/2020: Hematocrit 27.3, hemoglobin   8.6, MCV 108.0, platelets 115,000, WBC 5,300. 07/19/2020: Hematocrit 27.3, hemoglobin   8.6, MCV 108.0, platelets 115,000, WBC   5,300.  Labs on 05/26/2020 revealed a ferritin of 201, iron saturation >81%, and TIBC <91. Vitamin B12 was 271. The patient was instructed to stop oral iron.  Labs at [facility] on  07/16/2020 revealed 3% blasts.  The patient is followed by Dr. Gwynneth Albright for a history of a 2.1 cm left renal mass s/p cryoablation in 04/2019. Follow-up and abdominal CT is scheduled for 12/14/2020.  Symptomatically, he has felt good. Since surgery, his appetite has been low and he has been fatigued. He had a large bowel movement yesterday afternoon and now feels much better. He has lost 6-8 lbs. He has a runny nose and cough due to allergies. He had hematuria for about a week after surgery but this has resolved. He passed two clots.  The patient denies fevers, sweats, headaches, changes in vision, sore throat, shortness of breath, chest pain, palpitations, nausea, vomiting, diarrhea, reflux, urinary symptoms, bone or joint symptoms, skin changes, numbness, weakness, and balance or coordination problems.  He is not using any new medications or herbal products.  He denies a family history or blood disorder or cancer. His father had hepatitis C. Patient's HCV quant was negative on 04/09/2017. He did not become jaundiced after his transfusion.   Past Medical History:  Diagnosis Date  . Heart murmur   . 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 Hyperlipidemia   . Hypertension     Past Surgical History:  Procedure Laterality Date  . APPENDECTOMY  1967  . CATARACT EXTRACTION W/PHACO Right 06/18/2017   Procedure: CATARACT EXTRACTION PHACO AND INTRAOCULAR LENS PLACEMENT ([place]) RIGHT;  Surgeon: Leandrew Koyanagi, MD;  Location: [facility];  Service: Ophthalmology;  Laterality: Right;  requests to be last  . 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  Holmium laser of Prostate  06/2020  . RENAL CRYOABLATION Left 05/20/2019  . WISDOM TOOTH EXTRACTION      No family history on file.  Social History:  reports that he has never smoked. He has never used smokeless tobacco. He reports that he does not drink alcohol and does not use drugs. She has never used  tobacco. He very rarely drinking wine of Friday night. He denies exposure to radiation or toxins. He moved from [place] to [place] in 2014. He spends summers in [place]. 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 His wife is Thayer Headings. The patient is accompanied by Thayer Headings today.  Allergies: No Known Allergies  Current Medications: Current Outpatient Medications  Medication Sig Dispense Refill  . atorvastatin (LIPITOR) 80 MG tablet     . bisoprolol (ZEBETA) 5 MG tablet Take 2.5 mg by mouth.    . Calcium Carbonate-Vitamin D 600-400 MG-UNIT tablet Take 1 tablet by mouth daily.    . fluticasone (FLONASE) 50 MCG/ACT nasal spray Place into both nostrils.    Marland Kitchen omeprazole (PRILOSEC) 40 MG capsule Take 1 capsule (40 mg total) by mouth daily. 30 capsule 3  . vitamin C (ASCORBIC ACID) 500 MG tablet Take 500 mg by mouth daily.     No current facility-administered medications for this visit.    Review of Systems  Constitutional: Positive for weight loss (6-8 lbs per patient). Negative for chills, diaphoresis, fever and malaise/fatigue.  HENT: Negative for congestion, ear discharge, ear pain, hearing loss, nosebleeds, sinus pain, sore throat and tinnitus.   Eyes: Negative for blurred vision.  Respiratory: Positive for cough (allergies). Negative for hemoptysis, sputum production and shortness of breath.   Cardiovascular: Negative for chest pain, palpitations and leg swelling.  Gastrointestinal: Negative for abdominal pain, blood in stool, constipation, diarrhea, heartburn, melena, nausea and vomiting.  Genitourinary: Negative for dysuria, frequency, hematuria (s/p surgery, resolved) and urgency.  Musculoskeletal: Negative for back pain, joint pain, myalgias and neck pain.  Skin: Negative for itching and rash.  Neurological: Negative for dizziness, tingling, sensory change, weakness and headaches.  Endo/Heme/Allergies: Positive for environmental allergies (runny nose). Does not bruise/bleed easily.   Psychiatric/Behavioral: Negative for depression and memory loss. The patient is not nervous/anxious and does not have insomnia.   All other systems reviewed and are negative.  Performance status (ECOG): 0  Vitals Blood pressure (!) 111/52, pulse 63, temperature 98.1 F (36.7 C), temperature source Oral, resp. rate 18, weight 114 lb 12 oz (52 kg), SpO2 100 %.   Physical Exam Vitals and nursing note reviewed.  Constitutional:      General: He is not in acute distress.    Appearance: He is not diaphoretic.  HENT:     Head: Normocephalic and atraumatic.     Comments: Gray hair and beard.    Mouth/Throat:     Mouth: Mucous membranes are moist.     Pharynx: Oropharynx is clear.  Eyes:     General: No scleral icterus.    Extraocular Movements: Extraocular movements intact.     Conjunctiva/sclera: Conjunctivae normal.     Pupils: Pupils are equal, round, and reactive to light.  Cardiovascular:     Rate and Rhythm: Normal rate and regular rhythm.     Heart sounds: Normal heart sounds. No murmur heard.   Pulmonary:     Effort: Pulmonary effort is normal. No respiratory distress.     Breath sounds: Normal breath sounds. No wheezing or rales.  Chest:     Chest  wall: No tenderness.  Breasts:     Right: No axillary adenopathy or supraclavicular adenopathy.     Left: No axillary adenopathy or supraclavicular adenopathy.    Abdominal:     General: Bowel sounds are normal. There is no distension.     Palpations: Abdomen is soft. There is no mass.     Tenderness: There is no abdominal tenderness. There is no guarding or rebound.  Musculoskeletal:        General: No swelling or tenderness. Normal range of motion.     Cervical back: Normal range of motion and neck supple.  Lymphadenopathy:     Head:     Right side of head: No preauricular, posterior auricular or occipital adenopathy.     Left side of head: No preauricular, posterior auricular or occipital adenopathy.     Cervical: No  cervical adenopathy.     Upper Body:     Right upper body: No supraclavicular or axillary adenopathy.     Left upper body: No supraclavicular or axillary adenopathy.     Lower Body: No right inguinal adenopathy. No left inguinal adenopathy.  Skin:    General: Skin is warm and dry.  Neurological:     Mental Status: He is alert and oriented to person, place, and time.  Psychiatric:        Behavior: Behavior normal.        Thought Content: Thought content normal.        Judgment: Judgment normal.    No visits with results within 3 Day(s) from this visit.  Latest known visit with results is:  Office Visit on 05/26/2020  Component Date Value Ref Range Status  . Vitamin B-12 05/26/2020 271  232 - 1,245 pg/mL Final  . Total Iron Binding Capacity 05/26/2020 <91* 250 - 450 ug/dL Final  . UIBC 05/26/2020 <17* 111 - 343 ug/dL Final   **Verified by repeat analysis**  . Iron 05/26/2020 74  38 - 169 ug/dL Final  . Iron Saturation 05/26/2020 >81* 15 - 55 % Final  . Ferritin 05/26/2020 201  30 - 400 ng/mL Final    Assessment:  BUNYAN BRIER is a 80 y.o. male with a macrocytic anemia and thrombocytopenia.  Labs at [facility] on 07/16/2020 revealed 3% blasts.  He denies any new medications or herbal products.  Labs on 07/19/2020 revealed a hematocrit 27.3, hemoglobin 8.6, MCV 108.0, platelets 115,000, WBC 5,300.  Ferritin was 201 with an iron saturation >81%, and TIBC <91 on 05/26/2020. Vitamin B12 was 271. Creatinine was 1.61 on 05/05/2019 and 1.1 on 03/15/2021.  Bilirubin was 1.5 on 05/04/2020.  He is s/p Holmium laser enucleation of the prostate or bladder outlet obstruction secondary to BPH on 07/06/2020. He received 1 unit PRBCs on 07/07/2020.  He has a history of a 2.1 cm left renal mass s/p cryoablation.   Symptomatically, since surgery on 07/06/2020, his appetite has been low.  He has been fatigued. He has lost 6-8 lbs. He experienced hematuria for about a week after surgery. Exam is  unremakable.  Plan: 1.   Labs today:  CBC with diff, CMP, retic, B12, folate, TSH, LDH, haptoglobin, [place], SPEP, PT, PTT, flow cytometry. 2.   Peripheral smear for path review. 3.   Macrocytic anemia and thrombocytopenia  MCV initially normocytic then macrocytic.  Peripheral smear on 07/16/2020 revealed 3% blasts.  Discuss broad differential and multiple labs to assess etiology.  Rapid increase in MCV often represents elevated reticulocyte count rather than MDS or  underlying liver disease.  Multiple questions asked and answered. 4.   Mild B12 deficiency  B12 was 271 (slightly low) on 07/19/2020.  B12 goal is 400.  Discuss plans to recheck B12 and folate today and supplement as needed. 5.   RTC in 1 week for MD assessment, review of labs, and discussion regarding direction of therapy.  I discussed the assessment and treatment plan with the patient.  The patient was provided an opportunity to ask questions and all were answered.  The patient agreed with the plan and demonstrated an understanding of the instructions.  The patient was advised to call back if the symptoms worsen or if the condition fails to improve as anticipated.  I provided 32 minutes of face-to-face time during this this encounter and > 50% was spent counseling as documented under my assessment and plan.  An additional 10+ minutes were spent reviewing his chart (Epic and Care Everywhere) including notes, labs, and imaging studies.    Seth Dowson C. Mike Gip, MD, PhD    07/22/2020, 11:15 AM  I, Seth Woodard, am acting as Education administrator for [employer]. Mike Gip, MD, PhD.  I, Seth Woodard C. Mike Gip, MD, have reviewed the above documentation for accuracy and completeness, and I agree with the above.

## 2020-07-22 ENCOUNTER — Other Ambulatory Visit: Payer: Self-pay

## 2020-07-22 ENCOUNTER — Inpatient Hospital Stay: Payer: Medicare Other | Attending: Hematology and Oncology | Admitting: Hematology and Oncology

## 2020-07-22 ENCOUNTER — Inpatient Hospital Stay: Payer: Medicare Other

## 2020-07-22 VITALS — BP 111/52 | HR 63 | Temp 98.1°F | Resp 18 | Wt 114.7 lb

## 2020-07-22 DIAGNOSIS — E538 Deficiency of other specified B group vitamins: Secondary | ICD-10-CM | POA: Diagnosis not present

## 2020-07-22 DIAGNOSIS — D696 Thrombocytopenia, unspecified: Secondary | ICD-10-CM | POA: Insufficient documentation

## 2020-07-22 DIAGNOSIS — D649 Anemia, unspecified: Secondary | ICD-10-CM

## 2020-07-22 DIAGNOSIS — D539 Nutritional anemia, unspecified: Secondary | ICD-10-CM | POA: Diagnosis not present

## 2020-07-22 DIAGNOSIS — Z9049 Acquired absence of other specified parts of digestive tract: Secondary | ICD-10-CM | POA: Insufficient documentation

## 2020-07-22 DIAGNOSIS — Z79899 Other long term (current) drug therapy: Secondary | ICD-10-CM | POA: Insufficient documentation

## 2020-07-22 LAB — APTT: aPTT: 33 seconds (ref 24–36)

## 2020-07-22 LAB — COMPREHENSIVE METABOLIC PANEL
ALT: 12 U/L (ref 0–44)
AST: 21 U/L (ref 15–41)
Albumin: 4 g/dL (ref 3.5–5.0)
Alkaline Phosphatase: 58 U/L (ref 38–126)
Anion gap: 5 (ref 5–15)
BUN: 43 mg/dL — ABNORMAL HIGH (ref 8–23)
CO2: 30 mmol/L (ref 22–32)
Calcium: 8.9 mg/dL (ref 8.9–10.3)
Chloride: 103 mmol/L (ref 98–111)
Creatinine, Ser: 1.11 mg/dL (ref 0.61–1.24)
GFR, Estimated: >60 mL/min (ref >60)
Glucose, Bld: 105 mg/dL — ABNORMAL HIGH (ref 70–99)
Potassium: 4.1 mmol/L (ref 3.5–5.1)
Sodium: 138 mmol/L (ref 135–145)
Total Bilirubin: 0.7 mg/dL (ref 0.3–1.2)
Total Protein: 7.7 g/dL (ref 6.5–8.1)

## 2020-07-22 LAB — CBC WITH DIFFERENTIAL/PLATELET
Abs Immature Granulocytes: 0.06 10*3/uL (ref 0.00–0.07)
Basophils Absolute: 0 10*3/uL (ref 0.0–0.1)
Basophils Relative: 0 %
Eosinophils Absolute: 0.1 10*3/uL (ref 0.0–0.5)
Eosinophils Relative: 1 %
HCT: 25 % — ABNORMAL LOW (ref 39.0–52.0)
Hemoglobin: 8.3 g/dL — ABNORMAL LOW (ref 13.0–17.0)
Immature Granulocytes: 1 %
Lymphocytes Relative: 34 %
Lymphs Abs: 2.4 10*3/uL (ref 0.7–4.0)
MCH: 34.9 pg — ABNORMAL HIGH (ref 26.0–34.0)
MCHC: 33.2 g/dL (ref 30.0–36.0)
MCV: 105 fL — ABNORMAL HIGH (ref 80.0–100.0)
Monocytes Absolute: 0.9 10*3/uL (ref 0.1–1.0)
Monocytes Relative: 13 %
Neutro Abs: 3.6 10*3/uL (ref 1.7–7.7)
Neutrophils Relative %: 51 %
Platelets: 133 10*3/uL — ABNORMAL LOW (ref 150–400)
RBC: 2.38 MIL/uL — ABNORMAL LOW (ref 4.22–5.81)
RDW: 17.3 % — ABNORMAL HIGH (ref 11.5–15.5)
WBC: 6.9 10*3/uL (ref 4.0–10.5)
nRBC: 0.7 % — ABNORMAL HIGH (ref 0.0–0.2)

## 2020-07-22 LAB — DAT, POLYSPECIFIC AHG (ARMC ONLY): Polyspecific AHG test: NEGATIVE

## 2020-07-22 LAB — RETICULOCYTES
Immature Retic Fract: 20 % — ABNORMAL HIGH (ref 2.3–15.9)
RBC.: 2.39 MIL/uL — ABNORMAL LOW (ref 4.22–5.81)
Retic Count, Absolute: 31.3 10*3/uL (ref 19.0–186.0)
Retic Ct Pct: 1.3 % (ref 0.4–3.1)

## 2020-07-22 LAB — PATHOLOGIST SMEAR REVIEW

## 2020-07-22 LAB — VITAMIN B12: Vitamin B-12: 183 pg/mL (ref 180–914)

## 2020-07-22 LAB — LACTATE DEHYDROGENASE: LDH: 148 U/L (ref 98–192)

## 2020-07-22 LAB — TSH: TSH: 1.028 u[IU]/mL (ref 0.350–4.500)

## 2020-07-22 LAB — FOLATE: Folate: 11 ng/mL (ref >5.9)

## 2020-07-22 LAB — PROTIME-INR
INR: 1.2 (ref 0.8–1.2)
Prothrombin Time: 14.3 seconds (ref 11.4–15.2)

## 2020-07-23 ENCOUNTER — Telehealth: Payer: Self-pay | Admitting: *Deleted

## 2020-07-23 LAB — HAPTOGLOBIN: Haptoglobin: 159 mg/dL (ref 34–355)

## 2020-07-23 NOTE — Telephone Encounter (Signed)
Patient instructed to take b12 1,000 mcg tablets daily.

## 2020-07-23 NOTE — Telephone Encounter (Signed)
-----   Message from Lequita Asal, MD sent at 07/22/2020  4:49 PM EDT ----- Regarding: Please call patient  B12 is low.    Begin B12 1000 mcg po q day.  M  ----- Message ----- From: Buel Ream, [facility] [place] Sent: 07/22/2020  12:39 PM EDT To: Lequita Asal, MD

## 2020-07-27 LAB — MULTIPLE MYELOMA PANEL, SERUM
Albumin SerPl Elph-Mcnc: 3.1 g/dL (ref 2.9–4.4)
Albumin/Glob SerPl: 0.8 (ref 0.7–1.7)
Alpha 1: 0.6 g/dL — ABNORMAL HIGH (ref 0.0–0.4)
Alpha2 Glob SerPl Elph-Mcnc: 1.1 g/dL — ABNORMAL HIGH (ref 0.4–1.0)
B-Globulin SerPl Elph-Mcnc: 0.7 g/dL (ref 0.7–1.3)
Gamma Glob SerPl Elph-Mcnc: 1.7 g/dL (ref 0.4–1.8)
Globulin, Total: 4 g/dL — ABNORMAL HIGH (ref 2.2–3.9)
IgA: <5 mg/dL — ABNORMAL LOW (ref 61–437)
IgG (Immunoglobin G), Serum: 658 mg/dL (ref 603–1613)
IgM (Immunoglobulin M), Srm: 1784 mg/dL — ABNORMAL HIGH (ref 15–143)
M Protein SerPl Elph-Mcnc: 1.2 g/dL — ABNORMAL HIGH
Total Protein ELP: 7.1 g/dL (ref 6.0–8.5)

## 2020-07-27 LAB — COMP PANEL: LEUKEMIA/LYMPHOMA

## 2020-07-28 NOTE — Progress Notes (Signed)
[facility]  [address], [address] [place], [place] [address] Phone: [phone]  Fax: [phone]   Clinic Day:  07/29/2020  Referring physician: Glean Hess, MD  Chief Complaint: Seth Woodard is a 80 y.o. male with anemia who is seen for 1 review of work-up and discussion regarding direction of therapy.  HPI:  The patient was last seen in the hematology clinic on 07/22/2020 for new patient assessment. 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  He had lost 6-8 lbs. He was fatigued. He noted hematuria for about a week after surgery. Exam was unremakable.   Work-up revealed a hematocrit of 25.0, hemoglobin 8.3, MCV 105.0, platelets 133,000, WBC 6,900 with an ANC of 3600. Creatinine was 1.1. Reticulocyte count was 1.3%. Haptoglobin was 159. Vitamin B12 was 183 (low) and folate 11.0. LDH was 148. Coombswas negative. PT/INR was 14.3/1.2 and PTT 33. TSH was 1.028.  SPEP revealed a 1.2 gm/dL IgM monoclonal protein with kappa light chain specificity. IgM was 1,784 (15-143).   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  A CD5, CD10 and CD103 negative monoclonal B cell population was detected, non-specific phenotype, 7% of leukocytes, <5,000/uL. Increase aberrant myeloblasts was worrisome for a myeloid neoplasm. Correlation with all clinical information and a bone marrow biopsy were recommended for complete evaluation. A typical phenotype for chronic lymphocytic leukemia/small lymphocytic lymphoma, mantle cell lymphoma, hairy cell leukemia, or follicular center cell lymphoma was not detected. If the patient is not known to have a B-cell leukemia or lymphoma, further investigation is recommended if clinically indicated. In the absence of B-cell lymphoma, small (<5,000/uL) clonal B-cell populations in the blood are classified as monoclonal B-cell lymphocytosis.  Peripheral  smear revealed a normal WBC count and differential.  There was a macrocytic anemia with mild anisocytosis. There was thrombocytopenia, with unremarkable platelet morphology. The cause of these changes was unclear from morphologic review alone. Potential causes of macrocytic anemia and thrombocytopenia include nutritional deficiencies (B12, folate, etc), medication/toxin exposure, viral illness, autoimmune/rheumatologic disease, and/or chronic liver/kidney disease. Clinical correlation was recommended.  He was contacted and instructed to begin oral B12 1000 mcg daily on 07/23/2020.  During the interim, he has been "ok." He has had a small amount of hematuria. He reports poor appetite and fatigue. He started taking vitamin B12.   Past Medical History:  Diagnosis Date  . Heart murmur   . 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 Hyperlipidemia   . Hypertension     Past Surgical History:  Procedure Laterality Date  . APPENDECTOMY  1967  . CATARACT EXTRACTION W/PHACO Right 06/18/2017   Procedure: CATARACT EXTRACTION PHACO AND INTRAOCULAR LENS PLACEMENT ([facility]) RIGHT;  Surgeon: Leandrew Koyanagi, MD;  Location: [facility];  Service: Ophthalmology;  Laterality: Right;  requests to be last  . 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 Holmium laser of Prostate  06/2020  . RENAL CRYOABLATION Left 05/20/2019  . WISDOM TOOTH EXTRACTION      History reviewed. No pertinent family history.  Social History:  reports that he has never smoked. He has never used smokeless tobacco. He reports that he does not drink alcohol and does not use drugs. She has never used tobacco. He very rarely drinking wine of Friday night. He denies exposure to radiation or toxins. He moved from [place] to [place] in 2014. He  spends summers in [place]. 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 His wife is Seth Woodard. The patient is accompanied by Seth Woodard today.  Allergies: No Known  Allergies  Current Medications: Current Outpatient Medications  Medication Sig Dispense Refill  . atorvastatin (LIPITOR) 80 MG tablet     . bisoprolol (ZEBETA) 5 MG tablet Take 2.5 mg by mouth.    . Calcium Carbonate-Vitamin D 600-400 MG-UNIT tablet Take 1 tablet by mouth daily.    . cyanocobalamin 1000 MCG tablet Take 1,000 mcg by mouth daily.    . fluticasone (FLONASE) 50 MCG/ACT nasal spray Place into both nostrils.    Marland Kitchen omeprazole (PRILOSEC) 40 MG capsule Take 1 capsule (40 mg total) by mouth daily. 30 capsule 3  . vitamin C (ASCORBIC ACID) 500 MG tablet Take 500 mg by mouth daily.     No current facility-administered medications for this visit.    Review of Systems  Constitutional: Positive for malaise/fatigue and weight loss (4 lbs). Negative for chills, diaphoresis and fever.       Feels "okay."  HENT: Negative for congestion, ear discharge, ear pain, hearing loss, nosebleeds, sinus pain, sore throat and tinnitus.   Eyes: Negative for blurred vision.  Respiratory: Positive for cough (allergies). Negative for hemoptysis, sputum production and shortness of breath.   Cardiovascular: Negative for chest pain, palpitations and leg swelling.  Gastrointestinal: Negative for abdominal pain, blood in stool, constipation, diarrhea, heartburn, melena, nausea and vomiting.       Poor appetite.  Genitourinary: Positive for hematuria (small amount). Negative for dysuria, frequency and urgency.  Musculoskeletal: Negative for back pain, joint pain, myalgias and neck pain.  Skin: Negative for itching and rash.  Neurological: Negative for dizziness, tingling, sensory change, weakness and headaches.  Endo/Heme/Allergies: Positive for environmental allergies (runny nose). Does not bruise/bleed easily.  Psychiatric/Behavioral: Negative for depression and memory loss. The patient is not nervous/anxious and does not have insomnia.   All other systems reviewed and are negative.  Performance status  (ECOG): 1  Vitals Blood pressure (!) 105/58, pulse 68, temperature 97.6 F (36.4 C), resp. rate 16, weight 110 lb 12.8 oz (50.3 kg).   Physical Exam Vitals and nursing note reviewed.  Constitutional:      General: He is not in acute distress.    Appearance: He is not diaphoretic.  Eyes:     General: No scleral icterus.    Conjunctiva/sclera: Conjunctivae normal.  Neurological:     Mental Status: He is alert and oriented to person, place, and time.  Psychiatric:        Behavior: Behavior normal.        Thought Content: Thought content normal.        Judgment: Judgment normal.    No visits with results within 3 Day(s) from this visit.  Latest known visit with results is:  Appointment on 07/22/2020  Component Date Value Ref Range Status  . Haptoglobin 07/22/2020 159  34 - 355 mg/dL Final   Comment: (NOTE) Performed At: [facility] [place], [place] [id number] Rush Farmer MD [id number]   . IgG (Immunoglobin G), Serum 07/22/2020 658  603 - 1,613 mg/dL Final  . IgA 07/22/2020 <5* 61 - 437 mg/dL Final   Result confirmed on concentration.  . IgM (Immunoglobulin M), Srm 07/22/2020 1,784* 15 - 143 mg/dL Final   Comment: (NOTE) Results confirmed on dilution.   . Total Protein ELP 07/22/2020 7.1  6.0 - 8.5 g/dL Corrected  . Albumin SerPl Elph-Mcnc 07/22/2020  3.1  2.9 - 4.4 g/dL Corrected  . Alpha 1 07/22/2020 0.6* 0.0 - 0.4 g/dL Corrected  . Alpha2 Glob SerPl Elph-Mcnc 07/22/2020 1.1* 0.4 - 1.0 g/dL Corrected  . B-Globulin SerPl Elph-Mcnc 07/22/2020 0.7  0.7 - 1.3 g/dL Corrected  . Gamma Glob SerPl Elph-Mcnc 07/22/2020 1.7  0.4 - 1.8 g/dL Corrected  . M Protein SerPl Elph-Mcnc 07/22/2020 1.2* Not Observed g/dL Corrected  . Globulin, Total 07/22/2020 4.0* 2.2 - 3.9 g/dL Corrected  . Albumin/Glob SerPl 07/22/2020 0.8  0.7 - 1.7 Corrected  . IFE 1 07/22/2020 Comment*  Corrected   Comment: (NOTE) Immunofixation shows IgM monoclonal protein with kappa  light chain specificity.   . Please Note 07/22/2020 Comment   Corrected   Comment: (NOTE) Protein electrophoresis scan will follow via computer, mail, or courier delivery. Performed At: [facility] [place], [place] [id number] Rush Farmer MD [id number]   . PATH INTERP XXX-IMP 07/22/2020 Comment   Final   Comment: (NOTE) 1. Increased aberrant myeloblasts (4%) detected, see comment 2. A CD5, CD10 and CD103 negative monoclonal B cell population detected, non-specific phenotype, 7% of leukocytes, <5,000/uL, see comment   . ANNOTATION COMMENT IMP 07/22/2020 Comment   Corrected   Comment: (NOTE) 1. Increase aberrant myeloblasts is worrisome for a myeloid neoplasm. Correlation with all clinical information anb a bone marrow biopsy are recommended for complete evaluation. 2. A typical phenotype for chronic lymphocytic leukemia/small lymphocytic lymphoma, mantle cell lymphoma, hairy cell leukemia, or follicular center cell lymphoma is not detected. If the patient is not known to have a B-cell leukemia or lymphoma, further investigation is recommended if clinically indicated. In the absence of B-cell lymphoma, small (<5,000/uL) clonal B-cell populations in the blood are classified as monoclonal B-cell lymphocytosis.  Clinical correlation is required. This case received intradepartmental consultation and the results reflect the consensus opinion.   Marland Kitchen CLINICAL INFO 07/22/2020 Comment   Corrected   Comment: (NOTE) A recent CBC was not available for review at the time this report was prepared.   Marland Kitchen Specimen Type 07/22/2020 Comment   Final   Peripheral blood  . ASSESSMENT OF LEUKOCYTES 07/22/2020 Comment   Final   Comment: (NOTE) A CD5-/CD10-/CD103- monoclonal B cell population is detected with kappa light chain restriction, non-specific phenotype, representing 7% of leukocytes. There is no loss of, or aberrant expression of, the pan T cell antigens  to suggest a neoplastic T cell process. CD4:CD8 ratio 4.1 A small population of aberrant circulating myeloblasts is detected, representing 4% of leukocytes.  They are positive for dim CD45, CD34, CD117, CD13, CD33, CD38, HLA-DR and show partial aberrant CD7 expression. There is no immunophenotypic  evidence of abnormal myeloid maturation. Rare neutrophils show left-shifted maturation. Analysis of the leukocyte population shows: granulocytes 60%, monocytes 7%, lymphocytes 29%, blasts 4%, B cells 7%, T cells 21%, NK cells 1%.   . % Viable Cells 07/22/2020 Comment   Corrected   95%  . Immunophenotypic Profile 07/22/2020 Comment   Corrected   Comment: (NOTE) Abnormal cell population: present-4% of total cells (Phenotype below),7% of total cells (Phenotype below)   . ANALYSIS AND GATING STRATEGY 07/22/2020 Comment   Final   8 color analysis with CD45/SSC gating  . IMMUNOPHENOTYPING STUDY 07/22/2020 Comment   Final   Comment: (NOTE) CD2       N N            CD3       N N CD4  N N            CD5       N N CD7       ST N           CD8       N N CD10      N N            CD11b     N N CD11c     P N            CD13      P N CD14      N N            CD15      N N CD16      N N            CD19      N P CD20      N P            CD22      N P CD23      N N            CD33      P N CD34      P N            CD38      P N CD45      PD P           CD56      N N CD57      N N            CD103     N N CD117     P N            FMC-7     N P HLA-DR    P P            KAPPA     N P LAMBDA    N N            CD64      N N   . PATHOLOGIST NAME 07/22/2020 Comment   Final   Katheran James, M.D. Ph.D  . COMMENT: 07/22/2020 Comment   Corrected   Comment: (NOTE) Each antibody in this assay was utilized to assess for potential abnormalities of studied cell populations or to characterize identified abnormalities. This test was developed and its performance characteristics determined by [facility].  It has  not been cleared or approved by the U.S. Food and Drug Administration. The FDA has determined that such clearance or approval is not necessary. This test is used for clinical purposes.  It should not be regarded as investigational or for research. Performed At: [facility] [address] [place], [place] [id number] Katina Degree MDPhD [id number] Performed At: [facility] [address] [place], [place] [id number] Katina Degree MDPhD [id number]   . Polyspecific AHG test 07/22/2020    Final                   Value:NEG Performed at [facility], [address] [place], [place] [address]   . aPTT 07/22/2020 33  24 - 36 seconds Final   Performed at [facility], [address] [place], [place] [address]  . Prothrombin Time 07/22/2020 14.3  11.4 - 15.2 seconds Final  . INR 07/22/2020 1.2  0.8 - 1.2 Final   Comment: (NOTE) INR goal varies  based on device and disease states. Performed at [facility], [address], [place], [place] [address]   . TSH 07/22/2020 1.028  0.350 - 4.500 uIU/mL Final   Comment: Performed by a 3rd Generation assay with a functional sensitivity of <=0.01 uIU/mL. Performed at [facility], [address], [place], [place] [address]   . WBC 07/22/2020 6.9  4.0 - 10.5 K/uL Final  . RBC 07/22/2020 2.38* 4.22 - 5.81 MIL/uL Final  . Hemoglobin 07/22/2020 8.3* 13.0 - 17.0 g/dL Final  . HCT 07/22/2020 25.0* 39.0 - 52.0 % Final  . MCV 07/22/2020 105.0* 80.0 - 100.0 fL Final  . MCH 07/22/2020 34.9* 26.0 - 34.0 pg Final  . MCHC 07/22/2020 33.2  30.0 - 36.0 g/dL Final  . RDW 07/22/2020 17.3* 11.5 - 15.5 % Final  . Platelets 07/22/2020 133* 150 - 400 K/uL Final   Comment: Immature Platelet Fraction may be clinically indicated, consider ordering this additional test [id number]   . nRBC 07/22/2020 0.7* 0.0 - 0.2 % Final  . Neutrophils Relative % 07/22/2020 51  % Final  . Neutro Abs 07/22/2020 3.6  1.7 - 7.7 K/uL  Final  . Lymphocytes Relative 07/22/2020 34  % Final  . Lymphs Abs 07/22/2020 2.4  0.7 - 4.0 K/uL Final  . Monocytes Relative 07/22/2020 13  % Final  . Monocytes Absolute 07/22/2020 0.9  0.1 - 1.0 K/uL Final  . Eosinophils Relative 07/22/2020 1  % Final  . Eosinophils Absolute 07/22/2020 0.1  0.0 - 0.5 K/uL Final  . Basophils Relative 07/22/2020 0  % Final  . Basophils Absolute 07/22/2020 0.0  0.0 - 0.1 K/uL Final  . Immature Granulocytes 07/22/2020 1  % Final  . Abs Immature Granulocytes 07/22/2020 0.06  0.00 - 0.07 K/uL Final   Performed at [facility], [address], [place],  [address]  . Sodium 07/22/2020 138  135 - 145 mmol/L Final  . Potassium 07/22/2020 4.1  3.5 - 5.1 mmol/L Final  . Chloride 07/22/2020 103  98 - 111 mmol/L Final  . CO2 07/22/2020 30  22 - 32 mmol/L Final  . Glucose, Bld 07/22/2020 105* 70 - 99 mg/dL Final   Glucose reference range applies only to samples taken after fasting for at least 8 hours.  . BUN 07/22/2020 43* 8 - 23 mg/dL Final  . Creatinine, Ser 07/22/2020 1.11  0.61 - 1.24 mg/dL Final  . Calcium 07/22/2020 8.9  8.9 - 10.3 mg/dL Final  . Total Protein 07/22/2020 7.7  6.5 - 8.1 g/dL Final  . Albumin 07/22/2020 4.0  3.5 - 5.0 g/dL Final  . AST 07/22/2020 21  15 - 41 U/L Final  . ALT 07/22/2020 12  0 - 44 U/L Final  . Alkaline Phosphatase 07/22/2020 58  38 - 126 U/L Final  . Total Bilirubin 07/22/2020 0.7  0.3 - 1.2 mg/dL Final  . GFR, Estimated 07/22/2020 >60  >60 mL/min Final   Comment: (NOTE) Calculated using the CKD-EPI Creatinine Equation (2021)   . Anion gap 07/22/2020 5  5 - 15 Final   Performed at [facility], [address], [place],  [address]  . Retic Ct Pct 07/22/2020 1.3  0.4 - 3.1 % Final  . RBC. 07/22/2020 2.39* 4.22 - 5.81 MIL/uL Final  . Retic Count, Absolute 07/22/2020 31.3  19.0 - 186.0 K/uL Final  . Immature Retic Fract 07/22/2020 20.0* 2.3 - 15.9 % Final   Performed at [facility],  [address] [place], [place] [address]  . Vitamin B-12 07/22/2020 183  180 - 914 pg/mL Final   Comment: (NOTE) This assay is not validated for testing neonatal or myeloproliferative syndrome specimens for Vitamin B12 levels. Performed at [facility], [place] [address], [place], [place] [address]   . Folate 07/22/2020 11.0  >5.9 ng/mL Final   Performed at [facility], [address] [place], [place] [address]  . LDH 07/22/2020 148  98 - 192 U/L Final   Performed at [facility], [address] [place], [place] [address]  . Path Review 07/22/2020 Blood smear is reviewed.   Final   Comment: Normal WBC count and differential. Macrocytic anemia with mild anisocytosis. Thrombocytopenia, with unremarkable platelet morphology. The cause of these changes is unclear from morphologic review alone. Potential causes of macrocytic anemia and thrombocytopenia include nutritional deficiencies (B12, folate, etc), medication/toxin exposure, viral illness, autoimmune/rheumatologic disease, and/or chronic liver/kidney disease. Clinical correlation is recommended. Reviewed by Kathi Simpers, M.D. Performed at [facility], [address] [place], [place] [address]     Assessment:  Seth Woodard is a 80 y.o. male with a macrocytic anemia and thrombocytopenia.  Labs at [facility] on 07/16/2020 revealed 3% blasts.  He denies any new medications or herbal products.  Labs on 07/19/2020 revealed a hematocrit 27.3, hemoglobin 8.6, MCV 108.0, platelets 115,000, WBC 5,300.  Ferritin was 201 with an iron saturation >81%, and TIBC <91 on 05/26/2020. Vitamin B12 was 271. Creatinine was 1.61 on 05/05/2019 and 1.1 on 03/15/2021.  Bilirubin was 1.5 on 05/04/2020.  Work-upon 07/22/2020 revealed a hematocrit of 25.0, hemoglobin 8.3, MCV 105.0, platelets 133,000, WBC 6,900 with an ANC of 3600. Creatinine was 1.1. Reticulocyte count was 1.3%. Haptoglobin was 159. B12 was  183 (low) and folate 11.0. LDH was 148. Coombs was negative. PT/INR was 14.3/1.2 and PTT 33. TSH was 1.028.  SPEP revealed a 1.2 gm/dL IgM monoclonal protein with kappa light chain specificity. IgM was 1,784 (15-143).   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  A CD5, CD10 and CD103 negative monoclonal B cell population was detected, non-specific phenotype, 7% of leukocytes, <5,000/uL. A typical phenotype for chronic lymphocytic leukemia/small lymphocytic lymphoma, mantle cell lymphoma, hairy cell leukemia, or follicular center cell lymphoma was not detected.   Peripheral smear revealed a normal WBC count and differential.  There was a macrocytic anemia with mild anisocytosis. There was thrombocytopenia, with unremarkable platelet morphology.   He has B12 deficiency.  He began oral B12 on 07/23/2020.   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 He received 1 unit PRBCs on 07/07/2020.  He has a history of a 2.1 cm left renal mass s/p cryoablation.   Symptomatically, he feels "ok." He has had a small amount of hematuria.  Appetite is poor.  He is fatigued.  Plan: 1.   Review work-up.   2.   Macrocytic anemia and thrombocytopenia  MCV initially normocytic then macrocytic.  Peripheral smear on 07/16/2020 revealed 3% blasts.  Work-up confirmed B12 deficiency.  Flow cytometry revealed myeloblasts as well as a monoclonal B cell population.   Discuss possible acute leukemia.   Significance of monoclonal B cell population unclear.   Discuss the plan for bone marrow aspirate and biopsy.   Procedure described in detail.   Risks and benefits reviewed.  Multiple questions asked and answered. 3.   Mild B12 deficiency  B12 was 183 (low) on 07/22/2020.  B12 goal is 400.  He began oral B12 1000 mcg a day on 07/23/2020.  Check B12 level in 1 month. 4.   Bone marrow aspirate and biopsy on 08/03/2020. 5.   RTC 10  days after marrow for MD assessment and discussion regarding direction of therapy.  I discussed the assessment and treatment plan with the patient.  The patient was provided an opportunity to ask questions and all were answered.  The patient agreed with the plan and demonstrated an understanding of the instructions.  The patient was advised to call back if the symptoms worsen or if the condition fails to improve as anticipated.  I provided 24 minutes of face-to-face time during this this encounter and > 50% was spent counseling as documented under my assessment and plan.  An additional 10 minutes were spent reviewing his chart (Epic and Care Everywhere) including notes, labs, and imaging studies and phone follow-up with pathology.   Nitish Roes C. Mike Gip, MD, PhD    07/29/2020, 3:29 PM  I, Mirian Mo Tufford, am acting as Education administrator for [employer]. Mike Gip, MD, PhD.  I, Bayli Quesinberry C. Mike Gip, MD, have reviewed the above documentation for accuracy and completeness, and I agree with the above.

## 2020-07-29 ENCOUNTER — Telehealth: Payer: Self-pay

## 2020-07-29 ENCOUNTER — Other Ambulatory Visit: Payer: Self-pay

## 2020-07-29 ENCOUNTER — Inpatient Hospital Stay: Payer: Medicare Other | Attending: Hematology and Oncology | Admitting: Hematology and Oncology

## 2020-07-29 ENCOUNTER — Inpatient Hospital Stay: Payer: Medicare Other

## 2020-07-29 ENCOUNTER — Encounter: Payer: Self-pay | Admitting: Hematology and Oncology

## 2020-07-29 VITALS — BP 105/58 | HR 68 | Temp 97.6°F | Resp 16 | Wt 110.8 lb

## 2020-07-29 DIAGNOSIS — C9200 Acute myeloblastic leukemia, not having achieved remission: Secondary | ICD-10-CM | POA: Insufficient documentation

## 2020-07-29 DIAGNOSIS — D696 Thrombocytopenia, unspecified: Secondary | ICD-10-CM | POA: Insufficient documentation

## 2020-07-29 DIAGNOSIS — R799 Abnormal finding of blood chemistry, unspecified: Secondary | ICD-10-CM | POA: Diagnosis not present

## 2020-07-29 DIAGNOSIS — D539 Nutritional anemia, unspecified: Secondary | ICD-10-CM | POA: Insufficient documentation

## 2020-07-29 DIAGNOSIS — D649 Anemia, unspecified: Secondary | ICD-10-CM | POA: Diagnosis not present

## 2020-07-29 DIAGNOSIS — D72820 Lymphocytosis (symptomatic): Secondary | ICD-10-CM | POA: Insufficient documentation

## 2020-07-29 DIAGNOSIS — C8510 Unspecified B-cell lymphoma, unspecified site: Secondary | ICD-10-CM | POA: Insufficient documentation

## 2020-07-29 DIAGNOSIS — E538 Deficiency of other specified B group vitamins: Secondary | ICD-10-CM | POA: Insufficient documentation

## 2020-07-29 LAB — SAMPLE TO BLOOD BANK

## 2020-07-29 LAB — CBC WITH DIFFERENTIAL/PLATELET
Abs Immature Granulocytes: 0.3 10*3/uL — ABNORMAL HIGH (ref 0.00–0.07)
Basophils Absolute: 0 10*3/uL (ref 0.0–0.1)
Basophils Relative: 0 %
Eosinophils Absolute: 0.2 10*3/uL (ref 0.0–0.5)
Eosinophils Relative: 2 %
HCT: 22.6 % — ABNORMAL LOW (ref 39.0–52.0)
Hemoglobin: 7.6 g/dL — ABNORMAL LOW (ref 13.0–17.0)
Lymphocytes Relative: 33 %
Lymphs Abs: 3.3 10*3/uL (ref 0.7–4.0)
MCH: 34.9 pg — ABNORMAL HIGH (ref 26.0–34.0)
MCHC: 33.6 g/dL (ref 30.0–36.0)
MCV: 103.7 fL — ABNORMAL HIGH (ref 80.0–100.0)
Metamyelocytes Relative: 1 %
Monocytes Absolute: 1.9 10*3/uL — ABNORMAL HIGH (ref 0.1–1.0)
Monocytes Relative: 19 %
Myelocytes: 2 %
Neutro Abs: 3.6 10*3/uL (ref 1.7–7.7)
Neutrophils Relative %: 36 %
Other: 7 %
Platelets: 114 10*3/uL — ABNORMAL LOW (ref 150–400)
RBC Morphology: NONE SEEN
RBC: 2.18 MIL/uL — ABNORMAL LOW (ref 4.22–5.81)
RDW: 17.4 % — ABNORMAL HIGH (ref 11.5–15.5)
WBC Morphology: ABNORMAL
WBC: 10 10*3/uL (ref 4.0–10.5)
nRBC: 0.7 % — ABNORMAL HIGH (ref 0.0–0.2)
nRBC: 3 /100 WBC — ABNORMAL HIGH

## 2020-07-29 NOTE — Progress Notes (Signed)
Discuss lab results.  Reports low energy.  Appetite is decreasing since his surgery on 07/06/20 with a 4 lb wt loss since 07/22/20.

## 2020-07-29 NOTE — Telephone Encounter (Signed)
Bone marrow request faxed to specialty scheduling

## 2020-07-29 NOTE — Progress Notes (Signed)
Patient on schedule for BMB 08/02/2020,called and spoke with patient on phone with pre procedure instructions given. Made aware to be here @ 0830 with 0930 start time,NPO after MN prior to procedure and driver for discharge post procedure/recovery. Stated understanding.

## 2020-07-29 NOTE — Telephone Encounter (Signed)
Bone marrow bx scheduled for 08/02/20 arrive at 8:30 for 10:00.  IR nurse has already called and spoken to patient.  I will call him to verify he has received the appt details.

## 2020-07-30 ENCOUNTER — Other Ambulatory Visit: Payer: Self-pay | Admitting: Radiology

## 2020-07-30 ENCOUNTER — Inpatient Hospital Stay: Payer: Medicare Other

## 2020-07-30 ENCOUNTER — Other Ambulatory Visit: Payer: Self-pay | Admitting: Hematology and Oncology

## 2020-07-30 DIAGNOSIS — D72820 Lymphocytosis (symptomatic): Secondary | ICD-10-CM

## 2020-07-30 DIAGNOSIS — R799 Abnormal finding of blood chemistry, unspecified: Secondary | ICD-10-CM

## 2020-07-30 DIAGNOSIS — C9200 Acute myeloblastic leukemia, not having achieved remission: Secondary | ICD-10-CM | POA: Diagnosis not present

## 2020-07-30 DIAGNOSIS — D649 Anemia, unspecified: Secondary | ICD-10-CM

## 2020-07-30 LAB — ABO/RH: ABO/RH(D): A NEG

## 2020-07-30 LAB — PATHOLOGIST SMEAR REVIEW

## 2020-07-30 LAB — PREPARE RBC (CROSSMATCH)

## 2020-07-30 MED ORDER — ACETAMINOPHEN 325 MG PO TABS
650.0000 mg | ORAL_TABLET | Freq: Once | ORAL | Status: AC
  Administered 2020-07-30: 650 mg via ORAL
  Filled 2020-07-30: qty 2

## 2020-07-30 MED ORDER — DIPHENHYDRAMINE HCL 25 MG PO CAPS
25.0000 mg | ORAL_CAPSULE | Freq: Once | ORAL | Status: AC
Start: 2020-07-30 — End: 2020-07-30
  Administered 2020-07-30: 25 mg via ORAL
  Filled 2020-07-30: qty 1

## 2020-07-30 MED ORDER — SODIUM CHLORIDE 0.9% IV SOLUTION
250.0000 mL | Freq: Once | INTRAVENOUS | Status: AC
  Administered 2020-07-30: 250 mL via INTRAVENOUS
  Filled 2020-07-30: qty 250

## 2020-07-31 LAB — BPAM RBC
Blood Product Expiration Date: 202204182359
ISSUE DATE / TIME: 202204081205
Unit Type and Rh: 600

## 2020-07-31 LAB — TYPE AND SCREEN
ABO/RH(D): A NEG
Antibody Screen: NEGATIVE
Unit division: 0

## 2020-08-02 ENCOUNTER — Other Ambulatory Visit: Payer: Self-pay

## 2020-08-02 ENCOUNTER — Ambulatory Visit
Admission: RE | Admit: 2020-08-02 | Discharge: 2020-08-02 | Disposition: A | Discharge status: Home / Self Care | Payer: Medicare Other | Source: Ambulatory Visit | Attending: Hematology and Oncology | Admitting: Hematology and Oncology

## 2020-08-02 DIAGNOSIS — C8590 Non-Hodgkin lymphoma, unspecified, unspecified site: Secondary | ICD-10-CM | POA: Insufficient documentation

## 2020-08-02 DIAGNOSIS — C9200 Acute myeloblastic leukemia, not having achieved remission: Secondary | ICD-10-CM | POA: Diagnosis not present

## 2020-08-02 DIAGNOSIS — D696 Thrombocytopenia, unspecified: Secondary | ICD-10-CM | POA: Insufficient documentation

## 2020-08-02 DIAGNOSIS — D539 Nutritional anemia, unspecified: Secondary | ICD-10-CM | POA: Diagnosis not present

## 2020-08-02 DIAGNOSIS — D72820 Lymphocytosis (symptomatic): Secondary | ICD-10-CM

## 2020-08-02 DIAGNOSIS — R799 Abnormal finding of blood chemistry, unspecified: Secondary | ICD-10-CM

## 2020-08-02 HISTORY — DX: Gastro-esophageal reflux disease without esophagitis: K21.9

## 2020-08-02 LAB — CBC WITH DIFFERENTIAL/PLATELET
Abs Immature Granulocytes: 0.67 10*3/uL — ABNORMAL HIGH (ref 0.00–0.07)
Basophils Absolute: 0 10*3/uL (ref 0.0–0.1)
Basophils Relative: 0 %
Eosinophils Absolute: 0.1 10*3/uL (ref 0.0–0.5)
Eosinophils Relative: 1 %
HCT: 27.1 % — ABNORMAL LOW (ref 39.0–52.0)
Hemoglobin: 8.9 g/dL — ABNORMAL LOW (ref 13.0–17.0)
Immature Granulocytes: 4 %
Lymphocytes Relative: 24 %
Lymphs Abs: 4.2 10*3/uL — ABNORMAL HIGH (ref 0.7–4.0)
MCH: 33.1 pg (ref 26.0–34.0)
MCHC: 32.8 g/dL (ref 30.0–36.0)
MCV: 100.7 fL — ABNORMAL HIGH (ref 80.0–100.0)
Monocytes Absolute: 6 10*3/uL — ABNORMAL HIGH (ref 0.1–1.0)
Monocytes Relative: 34 %
Neutro Abs: 6.7 10*3/uL (ref 1.7–7.7)
Neutrophils Relative %: 37 %
Platelets: 113 10*3/uL — ABNORMAL LOW (ref 150–400)
RBC: 2.69 MIL/uL — ABNORMAL LOW (ref 4.22–5.81)
RDW: 21.4 % — ABNORMAL HIGH (ref 11.5–15.5)
Smear Review: NORMAL
WBC: 17.6 10*3/uL — ABNORMAL HIGH (ref 4.0–10.5)
nRBC: 0.5 % — ABNORMAL HIGH (ref 0.0–0.2)

## 2020-08-02 MED ORDER — FENTANYL CITRATE (PF) 100 MCG/2ML IJ SOLN
INTRAMUSCULAR | Status: AC
  Filled 2020-08-02: qty 2

## 2020-08-02 MED ORDER — SODIUM CHLORIDE 0.9 % IV SOLN: INTRAVENOUS | Status: DC

## 2020-08-02 MED ORDER — MIDAZOLAM HCL 2 MG/2ML IJ SOLN
INTRAMUSCULAR | Status: AC | PRN
  Administered 2020-08-02 (×2): 1 mg via INTRAVENOUS

## 2020-08-02 MED ORDER — MIDAZOLAM HCL 2 MG/2ML IJ SOLN
INTRAMUSCULAR | Status: AC
  Filled 2020-08-02: qty 2

## 2020-08-02 MED ORDER — HEPARIN SOD (PORK) LOCK FLUSH 100 UNIT/ML IV SOLN
INTRAVENOUS | Status: AC
  Filled 2020-08-02: qty 5

## 2020-08-02 MED ORDER — FENTANYL CITRATE (PF) 100 MCG/2ML IJ SOLN
INTRAMUSCULAR | Status: AC | PRN
  Administered 2020-08-02 (×2): 50 ug via INTRAVENOUS

## 2020-08-02 NOTE — Discharge Instructions (Signed)
Needle Biopsy of the Bone A needle biopsy of the bone is a procedure in which a small sample of bone is removed. The sample is taken with a needle. Then, the bone sample is looked at under a microscope to check for abnormalities. The sample is usually taken from a bone that is close to the skin. This procedure may be done to check for various problems with the bone. You may need this procedure if imaging tests or blood tests have indicated a possible problem. This procedure may be done to help determine if a bone tumor is cancerous (malignant). A bone biopsy can help to diagnose problems such as:  Tumors of the bone (sarcomas) and bone marrow (multiple myeloma).  Bone that forms abnormally (Paget's disease).  Noncancerous (benign) bone cysts.  Bony growths.  Infections in the bone. Tell a health care provider about:  Any allergies you have.  All medicines you are taking, including vitamins, herbs, eye drops, creams, and over-the-counter medicines.  Any problems you or family members have had with anesthetic medicines.  Any blood disorders you have.  Any surgeries you have had.  Any medical conditions you have. What are the risks? Generally, this is a safe procedure. However, problems may occur, including:  Excessive bleeding.  Infection.  Injury to surrounding tissue. What happens before the procedure? Medicines Ask your health care provider about:  Changing or stopping your regular medicines. This is especially important if you are taking diabetes medicines or blood thinners.  Taking medicines such as aspirin and ibuprofen. These medicines can thin your blood. Do not take these medicines unless your health care provider tells you to take them.  Taking over-the-counter medicines, vitamins, herbs, and supplements. General instructions  Follow instructions from your health care provider about eating or drinking restrictions.  Plan to have someone take you home from the  hospital or clinic.  If you will be going home right after the procedure, plan to have someone with you for 24 hours. What happens during the procedure?  An IV may be inserted into one of your veins.  The injection site will be cleaned with a germ-killing solution (antiseptic).  You may be given one or more of the following: ? A medicine to help you relax (sedative). ? A medicine to numb the area (local anesthetic).  The sample of bone will be removed by inserting a large needle through the skin and into the bone.  The needle will be removed.  Tissue from the needle will be sent to a laboratory to be analyzed.  A bandage (dressing) will be placed over the insertion site. The procedure may vary among health care providers and hospitals.   What happens after the procedure?  Your blood pressure, heart rate, breathing rate, and blood oxygen level may be monitored until you leave the hospital or clinic.  Do not drive for 24 hours if you were given a sedative during your procedure.  You may have numbness and loss of feeling in the area for a few hours after the test.  It is up to you to get the results of your procedure. Ask your health care provider, or the department that is doing the procedure, when your results will be ready. Summary  A needle biopsy of the bone is a procedure in which a small sample of bone is removed with a needle.  This procedure may be done to check for various problems with the bone.  Ask your health care provider about changing  or stopping your regular medicines before the procedure.  Ask your health care provider, or the department that is doing the procedure, when your results will be ready. This information is not intended to replace advice given to you by your health care provider. Make sure you discuss any questions you have with your health care provider. Document Revised: 12/20/2017 Document Reviewed: 12/20/2017 Elsevier Patient Education  2021  [employer].

## 2020-08-02 NOTE — Progress Notes (Signed)
Patient clinically stable post BMB per DR Pascal Lux, tolerated well. Awake/alert and oriented post procedure. Received Versed 2 mg along with Fentanyl 100 mcg IV for procedure. Vitals stable pre and post procedure. Report given to Carlynn Spry post procedure/recovery.

## 2020-08-02 NOTE — Procedures (Signed)
Pre-procedure Diagnosis: Macrocytic anemia and thrombocytopenia Post-procedure Diagnosis: Same  Technically successful CT guided bone marrow aspiration and biopsy of left iliac crest.   Complications: None Immediate  EBL: None  Signed: Sandi Mariscal Pager: [phone] 08/02/2020, 10:57 AM

## 2020-08-02 NOTE — H&P (Signed)
Chief Complaint: Macrocytic anemia and thrombocytopenia. Request is for bone marrow biopsy  Referring Physician(s): Eveleth C  Supervising Physician: Sandi Mariscal  Patient Status: [facility] - Out-pt  History of Present Illness: Seth Woodard is a 80 y.o. male Bone marrow biopsy. 80 y.o. male outpatient. History of HTN, HLD, Hep C antibody. Left sided renal mass s/p cryoablation (performed at OSH cytology negative for malignancy), BPH s/p Holmium Enucleation prostate surgery on 3.15.22. Found to be anemic post Holmium procedure. 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 Lab work revealed  macrocytic anemia and thrombocytopenia. 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   Currently without any significant complaints. Patient alert and laying in bed, calm and comfortable. Denies any fevers, headache, chest pain,  cough, abdominal pain, nausea, vomiting or bleeding. He endorses fatigue and SHOB with n and cold weather. Return precautions and treatment recommendations and follow-up discussed with the patient who is agreeable with the plan.  Past Medical History:  Diagnosis Date  . Heart murmur   . 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 Hyperlipidemia   . Hypertension     Past Surgical History:  Procedure Laterality Date  . APPENDECTOMY  1967  . CATARACT EXTRACTION W/PHACO Right 06/18/2017   Procedure: CATARACT EXTRACTION PHACO AND INTRAOCULAR LENS PLACEMENT ([place]) RIGHT;  Surgeon: Leandrew Koyanagi, MD;  Location: [place];  Service: Ophthalmology;  Laterality: Right;  requests to be last  . 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 Holmium laser of Prostate  06/2020  . RENAL CRYOABLATION Left 05/20/2019  . WISDOM TOOTH EXTRACTION      Allergies: Patient has no known allergies.  Medications: Prior to Admission medications   Medication Sig Start Date End Date Taking? Authorizing Provider   atorvastatin (LIPITOR) 80 MG tablet  09/17/18   [provider]  bisoprolol (ZEBETA) 5 MG tablet Take 2.5 mg by mouth. 07/22/20 07/22/21  [provider]  Calcium Carbonate-Vitamin D 600-400 MG-UNIT tablet Take 1 tablet by mouth daily.    [provider]  cyanocobalamin 1000 MCG tablet Take 1,000 mcg by mouth daily.    [provider]  fluticasone (FLONASE) 50 MCG/ACT nasal spray Place into both nostrils. 05/21/20   [provider]  omeprazole (PRILOSEC) 40 MG capsule Take 1 capsule (40 mg total) by mouth daily. 05/26/20   Glean Hess, MD  vitamin C (ASCORBIC ACID) 500 MG tablet Take 500 mg by mouth daily.    [provider]  bisoprolol-hydrochlorothiazide (ZIAC) 2.5-6.25 MG tablet Take 1 tablet by mouth daily. 04/18/17 04/29/19  Adline Potter, MD     No family history on file.  Social History   Socioeconomic History  . Marital status: Single    Spouse name: Not on file  . Number of children: Not on file  . Years of education: Not on file  . Highest education level: Not on file  Occupational History  . Not on file  Tobacco Use  . Smoking status: Never Smoker  . Smokeless tobacco: Never Used  Vaping Use  . Vaping Use: Never used  Substance and Sexual Activity  . Alcohol use: No  . Drug use: No  . Sexual activity: Never  Other Topics Concern  . Not on file  Social History Narrative  . Not on file   Social Determinants of Health   Financial Resource Strain: Not on file  Food Insecurity:  Not on file  Transportation Needs: Not on file  Physical Activity: Not on file  Stress: Not on file  Social Connections: Not on file     Review of Systems: A 12 point ROS discussed and pertinent positives are indicated in the HPI above.  All other systems are negative.  Review of Systems  Constitutional: Positive for fatigue. Negative for fever.  HENT: Negative for congestion.   Respiratory: Negative for cough and shortness of  breath ( with exertion and cold weather).   Cardiovascular: Negative for chest pain.  Gastrointestinal: Negative for abdominal pain.  Neurological: Negative for headaches.  Psychiatric/Behavioral: Negative for behavioral problems and confusion.    Vital Signs: There were no vitals taken for this visit.  Physical Exam Vitals and nursing note reviewed.  Constitutional:      Appearance: He is well-developed.  HENT:     Head: Normocephalic.  Cardiovascular:     Rate and Rhythm: Normal rate and regular rhythm.     Heart sounds: Normal heart sounds.  Pulmonary:     Effort: Pulmonary effort is normal.  Musculoskeletal:        General: Normal range of motion.     Cervical back: Normal range of motion.  Skin:    General: Skin is dry.  Neurological:     Mental Status: He is alert and oriented to person, place, and time.     Imaging: No results found.  Labs:  CBC: Recent Labs    03/15/20 0000 07/22/20 1153 07/29/20 1531  WBC 14.3 6.9 10.0  HGB 11.7* 8.3* 7.6*  HCT 37* 25.0* 22.6*  PLT 202 133* 114*    COAGS: Recent Labs    07/22/20 1153  INR 1.2  APTT 33    BMP: Recent Labs    03/15/20 0000 07/22/20 1153  NA 139 138  K 5.0 4.1  CL 101 103  CO2 29* 30  GLUCOSE  --  105*  BUN 25* 43*  CALCIUM 9.0 8.9  CREATININE 1.1 1.11  GFRNONAA  --  >60    LIVER FUNCTION TESTS: Recent Labs    03/15/20 0000 07/22/20 1153  BILITOT  --  0.7  AST  --  21  ALT  --  12  ALKPHOS  --  58  PROT  --  7.7  ALBUMIN 4.2 4.0    Assessment and Plan:  Bone marrow biopsy. 80 y.o. male outpatient. History of HTN, HLD, Hep C antibody. Left sided renal mass s/p cryoablation (performed at OSH cytology negative for malignancy), BPH s/p Holmium Enucleation Prostate Surgery on 3.15.22. Found to be anemic post Holmium procedure. 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 Lab work revealed macrocytic anemia and thrombocytopenia. Team is requesting a bone marrow biopsy for further evaluation  of monoclonal gammopathy.   All labs and medications are within acceptable parameters. NKDA. Patient has been NPO since midnight.  Risks and benefits of bone marrow was discussed with the patient and/or patient's family including, but not limited to bleeding, infection, damage to adjacent structures or low yield requiring additional tests.  All of the questions were answered and there is agreement to proceed.  Consent signed and in chart.  Thank you for this interesting consult.  I greatly enjoyed meeting Seth Woodard and look forward to participating in their care.  A copy of this report was sent to the requesting provider on this date.  Electronically Signed: Jacqualine Mau, NP 08/02/2020, 9:18 AM   I spent a total of  30 Minutes   in face to face in clinical consultation, greater than 50% of which was counseling/coordinating care for bone marrow biopsy

## 2020-08-03 ENCOUNTER — Telehealth: Payer: Self-pay | Admitting: Hematology and Oncology

## 2020-08-03 NOTE — Telephone Encounter (Signed)
patient is on the phone asking about his biopsy results -- wants to know if they are in and ready to go over -- requesting sooner appt if so    Told patient will call back with more information

## 2020-08-04 LAB — SURGICAL PATHOLOGY

## 2020-08-05 ENCOUNTER — Inpatient Hospital Stay (HOSPITAL_BASED_OUTPATIENT_CLINIC_OR_DEPARTMENT_OTHER): Payer: Medicare Other | Admitting: Hematology and Oncology

## 2020-08-05 ENCOUNTER — Other Ambulatory Visit: Payer: Self-pay

## 2020-08-05 ENCOUNTER — Inpatient Hospital Stay: Payer: Medicare Other

## 2020-08-05 ENCOUNTER — Encounter: Payer: Self-pay | Admitting: Hematology and Oncology

## 2020-08-05 VITALS — BP 109/57 | HR 62 | Temp 97.5°F | Resp 16 | Ht 67.0 in | Wt 113.5 lb

## 2020-08-05 DIAGNOSIS — C9200 Acute myeloblastic leukemia, not having achieved remission: Secondary | ICD-10-CM

## 2020-08-05 LAB — BASIC METABOLIC PANEL
Anion gap: 6 (ref 5–15)
BUN: 31 mg/dL — ABNORMAL HIGH (ref 8–23)
CO2: 28 mmol/L (ref 22–32)
Calcium: 8.8 mg/dL — ABNORMAL LOW (ref 8.9–10.3)
Chloride: 103 mmol/L (ref 98–111)
Creatinine, Ser: 1.24 mg/dL (ref 0.61–1.24)
GFR, Estimated: 59 mL/min — ABNORMAL LOW (ref >60)
Glucose, Bld: 77 mg/dL (ref 70–99)
Potassium: 4.8 mmol/L (ref 3.5–5.1)
Sodium: 137 mmol/L (ref 135–145)

## 2020-08-05 LAB — URIC ACID: Uric Acid, Serum: 5.1 mg/dL (ref 3.7–8.6)

## 2020-08-05 NOTE — Progress Notes (Signed)
[facility]  [address] [place], [place] [address] Phone: [phone]  Fax: [phone]   Clinic Day:  08/05/2020  Referring physician: Glean Hess, MD  Chief Complaint: Seth Woodard is a 80 y.o. male with anemia who is seen for review of bone marrow and discussion regarding direction of therapy.  HPI:  The patient was last seen in the hematology clinic on 07/29/2020. At that time, he felt "ok." He had a small amount of hematuria.  Appetite was poor.  He was fatigued. Hematocrit was 22.6, hemoglobin 7.6, MCV 103.7, platelets 114,000, WBC 10,000. He received 1 unit of PRBCs the following day.  Pathologist smear review revealed monocytosis. There were scattered circulating blasts. There was macrocytic anemia with mild anisocytosis and rare circulating nucleated RBCs. There was thrombocytopenia.  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 The bone marrow clot and core biopsy were markedly hypercellular for age with large aggregates of immature cells admixed with large aggregates of small lymphocytes. The bone marrow aspirate smears showed increased blasts/promonocytes (22% by manual differential count) and increased atypical mature monocytes. Flow cytometric analysis of the bone marrow aspirate identified an abnormal myelomonocytic population, including 9% CD34-positive blasts.CD34 immunohistochemistry performed on the clot and core biopsy highlighted 10-15% of the non-lymphoid component. The peripheral blood revealed an atypical monocytosis and circulating blasts with Auer rods. Together, the findings supported the diagnosis of acute myeloid leukemia with monocytic differentiation.  CD20 immunohistochemistry highlighted the large aggregates of small lymphocytes comprising approximately 40% and 60% of the clot and core biopsy,  respectively. By immunohistochemistry, the B-cells are negative for CD5, CD10, BCL6, and cyclin D1.   Flow cytometric analysis of the bone marrow aspirate identified a kappa-restricted B-cell population without expression of CD5, CD10, CD103, CD25, or CD11c. Together, the findings are consistent with a low-grade B-cell lymphoma with nonspecific immunophenotype.  During the interim, he is feeling "much better." His energy and appetite have improved. His bone marrow site is a bit sore. He denies any further hematuria s/p prostate procedure.   Past Medical History:  Diagnosis Date  . GERD (gastroesophageal reflux disease)   . Heart murmur   . Hyperlipidemia   . Hypertension     Past Surgical History:  Procedure Laterality Date  . APPENDECTOMY  1967  . CATARACT EXTRACTION W/PHACO Right 06/18/2017   Procedure: CATARACT EXTRACTION PHACO AND INTRAOCULAR LENS PLACEMENT ([place]) RIGHT;  Surgeon: Leandrew Koyanagi, MD;  Location: [place];  Service: Ophthalmology;  Laterality: Right;  requests to be last  . 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 Holmium laser of Prostate  06/2020  . RENAL CRYOABLATION Left 05/20/2019  . WISDOM TOOTH EXTRACTION      History reviewed. No pertinent family history.  Social History:  reports that he has never smoked. He has never used smokeless tobacco. He reports that he does not drink alcohol and does not use drugs. She has never used tobacco. He very rarely drinking wine of Friday night. He denies exposure to radiation or toxins. He moved from [place] to [place] in 2014. He spends summers in [place]. 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 His wife is Thayer Headings. The patient is accompanied by Thayer Headings today.  Allergies: No Known Allergies  Current Medications: Current Outpatient Medications  Medication Sig Dispense Refill  . atorvastatin (LIPITOR) 80 MG tablet     .  bisoprolol (ZEBETA) 5 MG tablet Take 2.5 mg by mouth.    . Calcium  Carbonate-Vitamin D 600-400 MG-UNIT tablet Take 1 tablet by mouth daily.    . cyanocobalamin 1000 MCG tablet Take 1,000 mcg by mouth daily.    . fluticasone (FLONASE) 50 MCG/ACT nasal spray Place into both nostrils.    Marland Kitchen omeprazole (PRILOSEC) 40 MG capsule Take 1 capsule (40 mg total) by mouth daily. 30 capsule 3  . vitamin C (ASCORBIC ACID) 500 MG tablet Take 500 mg by mouth daily.     No current facility-administered medications for this visit.    Review of Systems  Constitutional: Negative for chills, diaphoresis, fever, malaise/fatigue and weight loss (up 1 lb).       Feels "much better."  HENT: Negative for congestion, ear discharge, ear pain, hearing loss, nosebleeds, sinus pain, sore throat and tinnitus.   Eyes: Negative for blurred vision.  Respiratory: Positive for cough (allergies). Negative for hemoptysis, sputum production and shortness of breath.   Cardiovascular: Negative for chest pain, palpitations and leg swelling.  Gastrointestinal: Negative for abdominal pain, blood in stool, constipation, diarrhea, heartburn, melena, nausea and vomiting.       Appetite improved.  Genitourinary: Negative for dysuria, frequency, hematuria and urgency.  Musculoskeletal: Negative for back pain, joint pain, myalgias and neck pain.  Skin: Negative for itching and rash.  Neurological: Negative for dizziness, tingling, sensory change, weakness and headaches.  Endo/Heme/Allergies: Positive for environmental allergies (runny nose). Does not bruise/bleed easily.  Psychiatric/Behavioral: Negative for depression and memory loss. The patient is not nervous/anxious and does not have insomnia.   All other systems reviewed and are negative.  Performance status (ECOG): 1  Vitals Blood pressure (!) 109/57, pulse 62, temperature (!) 97.5 F (36.4 C), temperature source Tympanic, resp. rate 16, height _0  (1.702 m), weight 113 lb 8.6 oz (51.5 kg), SpO2 100 %.   Physical Exam Vitals and nursing note  reviewed.  Constitutional:      General: He is not in acute distress.    Appearance: He is not diaphoretic.  Eyes:     General: No scleral icterus.    Conjunctiva/sclera: Conjunctivae normal.  Neurological:     Mental Status: He is alert and oriented to person, place, and time.  Psychiatric:        Behavior: Behavior normal.        Thought Content: Thought content normal.        Judgment: Judgment normal.    No visits with results within 3 Day(s) from this visit.  Latest known visit with results is:  Hospital Outpatient Visit on 08/02/2020  Component Date Value Ref Range Status  . WBC 08/02/2020 17.6* 4.0 - 10.5 K/uL Final  . RBC 08/02/2020 2.69* 4.22 - 5.81 MIL/uL Final  . Hemoglobin 08/02/2020 8.9* 13.0 - 17.0 g/dL Final  . HCT 08/02/2020 27.1* 39.0 - 52.0 % Final  . MCV 08/02/2020 100.7* 80.0 - 100.0 fL Final  . MCH 08/02/2020 33.1  26.0 - 34.0 pg Final  . MCHC 08/02/2020 32.8  30.0 - 36.0 g/dL Final  . RDW 08/02/2020 21.4* 11.5 - 15.5 % Final  . Platelets 08/02/2020 113* 150 - 400 K/uL Final   PLATELET COUNT CONFIRMED BY SMEAR  . nRBC 08/02/2020 0.5* 0.0 - 0.2 % Final  . Neutrophils Relative % 08/02/2020 37  % Final  . Neutro Abs 08/02/2020 6.7  1.7 - 7.7 K/uL Final  . Lymphocytes Relative 08/02/2020 24  % Final  . Lymphs  Abs 08/02/2020 4.2* 0.7 - 4.0 K/uL Final  . Monocytes Relative 08/02/2020 34  % Final  . Monocytes Absolute 08/02/2020 6.0* 0.1 - 1.0 K/uL Final  . Eosinophils Relative 08/02/2020 1  % Final  . Eosinophils Absolute 08/02/2020 0.1  0.0 - 0.5 K/uL Final  . Basophils Relative 08/02/2020 0  % Final  . Basophils Absolute 08/02/2020 0.0  0.0 - 0.1 K/uL Final  . WBC Morphology 08/02/2020 MILD LEFT SHIFT (1-5% METAS, OCC MYELO, OCC BANDS)   Final  . RBC Morphology 08/02/2020 MORPHOLOGY UNREMARKABLE   Final  . Smear Review 08/02/2020 Normal platelet morphology   Final  . Immature Granulocytes 08/02/2020 4  % Final  . Abs Immature Granulocytes 08/02/2020 0.67*  0.00 - 0.07 K/uL Final  . Abnormal Lymphocytes Present 08/02/2020 PRESENT   Final   Performed at [facility], [address], [place], [place] [address]  . 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  The bone marrow aspirate smears show increased blasts/promonocytes (22% by manual differential count) and increased atypical mature monocytes.  Flow cytometric analysis of the bone marrow aspirate identifies an abnormal myelomonocytic population, including 9% CD34-posit                         ive blasts (see [phone]).  CD34 immunohistochemistry performed on the clot and core biopsy highlight 10-15% of the non-lymphoid component.  The peripheral blood reveals an atypical monocytosis and circulating blasts with Auer rods.  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  Correlation with cytogenetics and myeloid mutation panel testing is recommended.  CD20 immunohistochemistry highlights the large aggregates of small lymphocytes comprising approximately 40% and 60% of the clot and core biopsy, respectively. By immunohistochemistry, the B-cells are negative for CD5, CD10, BCL6, and cyclin D1.  Flow  cytometric analysis of the bone marrow aspirate identifies a kappa-restricted B-cell population without expression of CD5, CD10, CD103, CD25, or CD11c (see [id number]). Together, the findings are consistent with a low-grade B-cell lymphoma with nonspecific immunophenotype.  Dr. Marianna Payment has reviewed the case                          and concurs with the bone marrow final diagnoses.  Findings discussed with Dr. Mike Gip on 08/04/20 at 1340 by S. O'Neill.   MICROSCOPIC DESCRIPTION:  PERIPHERAL BLOOD SMEAR: Leukocytosis with atypical monocytosis and circulating blasts/blast equivalents (7%).  Auer rods are present.  Mild lymphocytosis with small lymphocytes with mature chromatin and scant cytoplasm.  Macrocytic anemia with anisopoikilocytosis, polychromasia and rare circulating nucleated RBCs.  Platelets are decreased in number with normal morphology.   BONE MARROW ASPIRATE: Spicular, cellular, and adequate for evaluation. Erythroid precursors: Markedly decreased, with  a spectrum of maturation. Scattered erythroid precursors with nuclear irregularities or nuclear-cytoplasmic dyssynchrony are present. Granulocytic precursors: Blasts/promyelocytes are increased.  Rare Auer rods are identified.  Mature monocytes including those with atypical morphology are also increased.  Maturing myeloid cells ar                         e decreased and without overt dysplasia. Megakaryocytes: Present, including those with atypical nuclear lobation. Lymphocytes/plasma cells: Lymphocytes are increased in number, with small size, mature chromatin, and scant cytoplasm.  Plasma cells are not increased in number.  TOUCH PREPARATIONS: Cellular, with findings similar to aspirate smears.  CLOT AND BIOPSY: The bone marrow clot and core biopsy reveal a hypercellular marrow for age (95%).  Large aggregates of immature appearing cells are admixed with large aggregates of small lymphocytes. Maturing myeloid  and erythroid elements are decreased.  Scattered megakaryocytes are present.  The lymphoid aggregates show a tendency for paratrabecular location.  Immunohistochemistry on the clot and core biopsy is performed for further evaluation.  CD34 immunohistochemistry highlights approximately 10-15% of cells in the non-lymphoid areas.  The majority of cells in the non-lymphoid areas are positive for MPO with a decreas                         e in E-cadherin-positive early erythroid precursors.  CD117 reveals an increase in immature cells.  CD20 highlights the lymphoid proliferation, comprising approximately 40% and 60% of overall marrow cellularity in the clot and core biopsy, respectively.  The B-cells are negative for CD5, CD10, BCL6, and cyclin D1.  CD3 highlights background T-cells.  IRON STAIN: Iron stains are performed on a bone marrow aspirate smear and section of clot. The controls stained appropriately.       Storage Iron: Present.      Ring Sideroblasts: None identified.  ADDITIONAL DATA/TESTING: Cytogenetics and molecular myeloid panel are pending.   CELL COUNT DATA:  Bone Marrow count performed on 500 cells shows: Blasts:   22%  Myeloid:  55% Promyelocytes: 0%   Erythroid:     7% Myelocytes:    12%  Lymphocytes:   18% Metamyelocytes:     3%   Plasma cells:  2% Bands:    1% Neutrophils:   15%  M:E ratio:     48.0 Eosinophils:   2% Basophils:     0% Monocytes:     18%  Lab Data: CBC                          performed on 08/02/20 shows: WBC: 17.6 k/uL Neutrophils:   45% Hgb: 8.9 g/dL  Lymphocytes:   26% HCT: 27.1 %    Monocytes:     20% MCV: 100.7 fL  Eosinophils:   0% RDW: 21.4 %    Basophils:     0% PLT: 113 k/uL  Blasts:           7%  GROSS DESCRIPTION:  A: Aspirate smear  B: The specimen is received in B-plus fixative and consists of a 5.0 x 5.0 x 2.0 mm aggregate of red-brown clotted blood.  The specimen is entirely submitted in 1 cassette.  C: The specimen  is received in B-plus fixative and consists of 2 cores of tan bone, measuring 0.8 and 2.1 cm in length by 0.2 cm in diameter. The specimen is entirely submitted in 1 cassette following decalcification with  Immunocal.  Craig Staggers 08/02/2020)   Final Diagnosis performed by Jolene Provost.   Electronically signed 08/04/2020 Technical component performed at [facility], [place] [address], [place], [place] [address].  Professional component performed at [employer]. [facility], [place] [address]                         t, [place], [place] [address].  Immunohistochemistry Technical component (if applicable) was performed at [facility]. [address], [place], [place], [place] [address].   IMMUNOHISTOCHEMISTRY DISCLAIMER (if applicable): Some of these immunohistochemical stains may have been developed and the performance characteristics determine by [facility]. Some may not have been cleared or approved by the U.S. Food and Drug Administration. The FDA has determined that such clearance or approval is not necessary. This test is used for clinical purposes. It should not be regarded as investigational or for research. This laboratory is certified under the [place] (CLIA-88) as qualified to perform high complexity clinical laboratory testing.  The controls stained appropriately.   . SURGICAL PATHOLOGY 08/02/2020    Final-Edited                   Value:Surgical Pathology CASE: [id number] PATIENT: Seth Woodard Flow Pathology Report     Clinical history: macrocytic anemia and thrombocytopenia     DIAGNOSIS:  BONE MARROW; FLOW CYTOMETRIC ANALYSIS: -  Abnormal myelomonocytic population present -  Monotypic B-cell population identified -  See comment  COMMENT:  Flow cytometric analysis identifies a population of blasts expressing CD45 (dim), CD34, CD117, CD13, CD33, CD7, CD11c, CD123, HLA-DR,  CD38, and cytoplasmic MPO comprising approximately 9% of all cells.  Monocytic cells comprise approximately 21% of all cells and show expression of CD4, CD11b, CD11c, CD13, CD14, CD33, CD38, CD45, CD64, heterogeneous CD123, HLA-DR, and cytoplasmic MPO without expression of CD34, CD117, CD15 or CD56.  Additionally, a monotypic B-cell population is identified, comprising approximately 67% of lymphocytes (22% of all cells).  The B-cell population expresses CD19, CD20, CD200, HLA-DR, and kappa-restricted surface light                          chains, and is negative for CD5, CD10, CD11c, CD103, CD25, and other markers tested.  Correlation with concurrent morphology is recommended for complete diagnostic interpretation and overall blast enumeration (see [phone]).   GATING AND PHENOTYPIC ANALYSIS:  Gated population: Flow cytometric immunophenotyping is performed using antibodies to the antigens listed in the table below. Electronic gates are placed around a cell cluster displaying light scatter properties corresponding to: lymphocytes and blasts  Abnormal Cells in sample: 9% blasts  Phenotype of Abnormal Cells: CD7, CD13, CD33, CD34, CD38, CD117, CD123, HLA-DR, MPO   Abnormal Cells in sample: 22% monotypic B-cells  Phenotype of Abnormal Cells: CD19, CD20, CD200, HLA-DR, Kappa   Blasts:                    Lymphoid Antigens       Myeloid Antigens Miscellaneous CD2  NEG  CD10 NEG  CD11b     NEG  CD45 POS CD3  NEG  CD19 NEG  CD11c     POS  HLA-DR    POS CD4  NEG  CD20 NEG  CD13 POS  CD34 POS CD5  NEG  CD22 NEG  CD14 NEG  CD38 POS CD7  POS  CD79b     ND   CD15 NEG  CD138     ND CD8  NEG  CD103     NEG  CD16 NEG  TdT  ND CD25 NEG  CD200     NEG  CD33 POS  CD123     POS TCRab     ND   sKappa    NEG  CD64 NEG  CD41 ND TCRgd     NEG  sLambda   NEG  CD117     POS  CD61 ND CD56 NEG  cKappa    ND   MPO  POS  CD71 ND CD57 ND   cLambda   ND         [id number]    Monotypic B-cells:                    Lymphoid Antigens       Myeloid Antigens Miscellaneous CD2  NEG  CD10 NEG  CD11b     NEG  CD45 POS CD3  NEG  CD19 POS  CD11c     NEG  HLA-DR    POS CD4  NEG  CD20 POS  CD13 NEG  CD34 NEG CD5  NEG  CD22 NEG  CD14 NEG  CD38 NEG CD7  NEG  CD79b     ND   CD15 NEG  CD138     ND CD8  NEG  CD103     NEG  CD16 NEG  TdT  ND CD25 NEG  CD200     POS  CD33 NEG  CD123     NEG TCRab     ND   sKappa    POS  CD64 NEG  CD41 ND TCRgd     NEG  sLambda   NEG  CD117     NEG  CD61 ND CD56 NEG  cKappa    ND   MPO  NEG  CD71 ND CD57 ND   cLambda   ND        [id number]   GROSS DESCRIPTION:   Reference Bone Marrow case [id number].   Final Diagnosis performed by Jolene Provost.   Electronically signed 08/04/2020 Technical component performed at [facility], [place] [address] [place], [place] [address].  Professional component performed at [employer]. [facility], [place] [address] [place], [place] [address].     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  He presented with macrocytic anemia and thrombocytopenia.    Labs on 07/19/2020 revealed a hematocrit 27.3, hemoglobin 8.6, MCV 108.0, platelets 115,000, WBC 5,300.  Ferritin was 201 with an iron saturation >81%, and TIBC <91 on 05/26/2020. Vitamin B12 was 271. Creatinine was 1.61 on 05/05/2019 and 1.1 on 03/15/2021.  Bilirubin was 1.5 on 05/04/2020.  Work-upon 07/22/2020 revealed a hematocrit of 25.0, hemoglobin 8.3, MCV 105.0, platelets 133,000, WBC 6,900 with an ANC of 3600. Creatinine was 1.1. Reticulocyte count was 1.3%. Haptoglobin was 159. B12 was 183 (low) and folate 11.0. LDH was 148. Coombs was negative. PT/INR was 14.3/1.2 and PTT 33. TSH was 1.028.  SPEP revealed a 1.2 gm/dL IgM monoclonal protein with kappa light chain specificity. IgM was 1,784 (15-143).   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  A CD5, CD10  and CD103 negative monoclonal B cell population was detected, non-specific phenotype, 7% of leukocytes, <5,000/uL. A typical phenotype  for chronic lymphocytic leukemia/small lymphocytic lymphoma, mantle cell lymphoma, hairy cell leukemia, or follicular center cell lymphoma was not detected.   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 The bone marrow clot and core biopsy were markedly hypercellular for age with large aggregates of immature cells admixed with large aggregates of small lymphocytes. The bone marrow aspirate smears showed increased blasts/promonocytes (22% by manual differential count) and increased atypical mature monocytes. Flow cytometry of the bone marrow identified an abnormal myelomonocytic population, including 9% CD34-positive blasts.CD34 immunohistochemistry performed on the clot and core biopsy highlighted 10-15% of the non-lymphoid component. The peripheral blood revealed an atypical monocytosis and circulating blasts with Auer rods. 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  He has B12 deficiency.  He began oral B12 on 07/23/2020.   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 He received 1 unit PRBCs on 07/07/2020.  He has a history of a 2.1 cm left renal mass s/p cryoablation.   Symptomatically, his energy level has improved.  He denies any B symptoms.  Plan: 1.   Labs today:  BMP, uric acid. 2.   Acute myelogenous leukemia  Patient initially presented with macrocytic anemia and thrombocytopenia   Peripheral smear on 07/16/2020 revealed 3% blasts.   Work-up revealed a B12 deficiency.   Peripheral flow cytometry revealed myeloblasts and a monoclonal B cell population.  Bone marrow on 08/02/2020 confirmed AML.  Patient wishes to  pursue treatment.  Discuss treatment of AML.  Discuss referral to [facility] for management.  Patient in agreement.  Multiple questions asked and answered. 3.   Low grade non-Hodgkin's B cell lymphoma  Bone marrow on 08/02/2020 confirmed a different population of cells.  Discuss management of low grade lymphomas.   Treatment plan will be focused on AML.  Several questions asked and answered.  4.   Mild B12 deficiency  B12 was 183 (low) on 07/22/2020.  B12 goal is 400.  He began oral B12 1000 mcg a day on 07/23/2020.  Check B12 level in 1 month. 5.   Referral to [facility] hematology- contact made. 6.   Patient to call after consult.  I discussed the assessment and treatment plan with the patient.  The patient was provided an opportunity to ask questions and all were answered.  The patient agreed with the plan and demonstrated an understanding of the instructions.  The patient was advised to call back if the symptoms worsen or if the condition fails to improve as anticipated.  I provided 26 minutes of face-to-face time during this this encounter and > 50% was spent counseling as documented under my assessment and plan. An additional 10 minutes were spent reviewing his chart ([facility] [place]) including notes, labs, and imaging studies and discussing bone marrow findings with pathology.  I spoke to Dr Katina Dung at [facility] who will be helping to facilitate assessment by one of his colleagues.    Staci Dack C. Mike Gip, MD, PhD    08/05/2020, 12:59 PM  I, Mirian Mo Tufford, am acting as Education administrator for [employer]. Mike Gip, MD, PhD.  I, Vern Prestia C. Mike Gip, MD, have reviewed the above documentation for accuracy and completeness, and I agree with the above.

## 2020-08-09 ENCOUNTER — Encounter (HOSPITAL_COMMUNITY): Payer: Self-pay | Admitting: Hematology and Oncology

## 2020-08-09 DIAGNOSIS — N179 Acute kidney failure, unspecified: Secondary | ICD-10-CM | POA: Insufficient documentation

## 2020-08-09 DIAGNOSIS — C9200 Acute myeloblastic leukemia, not having achieved remission: Secondary | ICD-10-CM | POA: Insufficient documentation

## 2020-08-10 ENCOUNTER — Encounter (HOSPITAL_COMMUNITY): Payer: Self-pay | Admitting: Hematology and Oncology

## 2020-08-10 LAB — SURGICAL PATHOLOGY

## 2020-08-12 ENCOUNTER — Ambulatory Visit: Payer: Medicare Other | Admitting: Hematology and Oncology

## 2020-08-15 DIAGNOSIS — E883 Tumor lysis syndrome: Secondary | ICD-10-CM | POA: Insufficient documentation

## 2020-08-18 ENCOUNTER — Other Ambulatory Visit: Payer: Self-pay

## 2020-08-18 DIAGNOSIS — D696 Thrombocytopenia, unspecified: Secondary | ICD-10-CM

## 2020-08-18 DIAGNOSIS — D649 Anemia, unspecified: Secondary | ICD-10-CM

## 2020-08-18 DIAGNOSIS — C9200 Acute myeloblastic leukemia, not having achieved remission: Secondary | ICD-10-CM

## 2020-08-18 DIAGNOSIS — D72820 Lymphocytosis (symptomatic): Secondary | ICD-10-CM

## 2020-08-20 ENCOUNTER — Other Ambulatory Visit: Payer: Self-pay

## 2020-08-20 DIAGNOSIS — D696 Thrombocytopenia, unspecified: Secondary | ICD-10-CM

## 2020-08-20 DIAGNOSIS — C9200 Acute myeloblastic leukemia, not having achieved remission: Secondary | ICD-10-CM

## 2020-08-20 DIAGNOSIS — D649 Anemia, unspecified: Secondary | ICD-10-CM

## 2020-08-20 DIAGNOSIS — D72820 Lymphocytosis (symptomatic): Secondary | ICD-10-CM

## 2020-08-25 ENCOUNTER — Ambulatory Visit: Payer: Self-pay | Admitting: Internal Medicine

## 2020-08-25 ENCOUNTER — Other Ambulatory Visit: Payer: Medicare Other

## 2020-08-26 ENCOUNTER — Ambulatory Visit: Payer: Medicare Other

## 2020-08-30 ENCOUNTER — Other Ambulatory Visit: Payer: Medicare Other

## 2020-08-31 ENCOUNTER — Ambulatory Visit: Payer: Medicare Other

## 2020-09-01 ENCOUNTER — Other Ambulatory Visit: Payer: Medicare Other

## 2020-09-02 ENCOUNTER — Ambulatory Visit: Payer: Medicare Other

## 2020-09-06 ENCOUNTER — Other Ambulatory Visit: Payer: Medicare Other

## 2020-09-07 ENCOUNTER — Ambulatory Visit: Payer: Medicare Other

## 2020-09-08 ENCOUNTER — Other Ambulatory Visit: Payer: Medicare Other

## 2020-09-09 ENCOUNTER — Ambulatory Visit: Payer: Medicare Other

## 2020-12-06 DIAGNOSIS — R000 Tachycardia, unspecified: Secondary | ICD-10-CM | POA: Insufficient documentation

## 2020-12-20 ENCOUNTER — Encounter: Payer: Self-pay | Admitting: Internal Medicine

## 2020-12-20 ENCOUNTER — Ambulatory Visit (INDEPENDENT_AMBULATORY_CARE_PROVIDER_SITE_OTHER): Payer: Medicare Other

## 2020-12-20 DIAGNOSIS — Z0000 Encounter for general adult medical examination without abnormal findings: Secondary | ICD-10-CM

## 2020-12-20 DIAGNOSIS — C8590 Non-Hodgkin lymphoma, unspecified, unspecified site: Secondary | ICD-10-CM | POA: Insufficient documentation

## 2020-12-20 NOTE — Progress Notes (Signed)
Subjective:   Seth Woodard is a 80 y.o. male who presents for an Initial Medicare Annual Wellness Visit.  Virtual Visit via Telephone Note  I connected with  Seth Woodard on 12/20/20 at 10:00 AM EDT by telephone and verified that I am speaking with the correct person using two identifiers.  Location: Patient: home Provider: [facility] Persons participating in the virtual visit: [place]   I discussed the limitations, risks, security and privacy concerns of performing an evaluation and management service by telephone and the availability of in person appointments. The patient expressed understanding and agreed to proceed.  Interactive audio and video telecommunications were attempted between this nurse and patient, however failed, due to patient having technical difficulties OR patient did not have access to video capability.  We continued and completed visit with audio only.  Some vital signs may be absent or patient reported.   Seth Marker, LPN   Review of Systems     Cardiac Risk Factors include: advanced age (>69mn, >>5women);dyslipidemia;male gender;hypertension     Objective:    There were no vitals filed for this visit. There is no height or weight on file to calculate BMI.  Advanced Directives 12/20/2020 08/02/2020 07/29/2020 04/29/2019 06/18/2017  Does Patient Have a Medical Advance Directive? Yes - Yes No Yes  Type of AParamedicof [place]kinsLiving will Healthcare Power of [place] CenterLiving will - [place] will  Copy of H[place]in Chart? No - copy requested - - - No - copy requested    Current Medications (verified) Outpatient Encounter Medications as of 12/20/2020  Medication Sig   bisoprolol (ZEBETA) 5 MG tablet Take 2.5 mg by mouth.   valACYclovir (VALTREX) 500 MG tablet Take 500 mg by mouth daily.   XOSPATA 40 MG TABS Take 3 tablets by mouth in the  morning.   [DISCONTINUED] atorvastatin (LIPITOR) 80 MG tablet    [DISCONTINUED] bisoprolol-hydrochlorothiazide (ZIAC) 2.5-6.25 MG tablet Take 1 tablet by mouth daily.   [DISCONTINUED] Calcium Carbonate-Vitamin D 600-400 MG-UNIT tablet Take 1 tablet by mouth daily.   [DISCONTINUED] cyanocobalamin 1000 MCG tablet Take 1,000 mcg by mouth daily.   [DISCONTINUED] fluticasone (FLONASE) 50 MCG/ACT nasal spray Place into both nostrils.   [DISCONTINUED] omeprazole (PRILOSEC) 40 MG capsule Take 1 capsule (40 mg total) by mouth daily.   [DISCONTINUED] vitamin C (ASCORBIC ACID) 500 MG tablet Take 500 mg by mouth daily.   No facility-administered encounter medications on file as of 12/20/2020.    Allergies (verified) Patient has no known allergies.   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 No pertinent family history. Social History   Socioeconomic History   Marital status: Married    Spouse name: Seth Woodard  Number of children: Not on file   Years of education: Not  on file   Highest education level: Not on file  Occupational History   Not on file  Tobacco Use   Smoking status: Never   Smokeless tobacco: Never  Vaping Use   Vaping Use: Never used  Substance and Sexual Activity   Alcohol use: No   Drug use: No   Sexual activity: Not Currently  Other Topics Concern   Not on file   Social History Narrative   Not on file   Social Determinants of Health   Financial Resource Strain: Low Risk    Difficulty of Paying Living Expenses: Not hard at all  Food Insecurity: No Food Insecurity   Worried About Charity fundraiser in the Last Year: Never true   [place] in the Last Year: Never true  Transportation Needs: No Transportation Needs   Lack of Transportation (Medical): No   Lack of Transportation (Non-Medical): No  Physical Activity: Inactive   Days of Exercise per Week: 0 days   Minutes of Exercise per Session: 0 min  Stress: No Stress Concern Present   Feeling of Stress : Not at all  Social Connections: Moderately Isolated   Frequency of Communication with Friends and Family: More than three times a week   Frequency of Social Gatherings with Friends and Family: More than three times a week   Attends Religious Services: Never   Marine scientist or Organizations: No   Attends Music therapist: Never   Marital Status: Married    Tobacco Counseling Counseling given: Not Answered   Clinical Intake:  Pre-visit preparation completed: Yes  Pain : No/denies pain     Nutritional Risks: None Diabetes: No  How often do you need to have someone help you when you read instructions, pamphlets, or other written materials from your doctor or pharmacy?: 1 - Never   Interpreter Needed?: No  Information entered by :: Seth Marker LPN   Activities of Daily Living In your present state of health, do you have any difficulty performing the following activities: 12/20/2020 08/02/2020  Hearing? N N  Vision? N N  Difficulty concentrating or making decisions? N N  Walking or climbing stairs? N N  Dressing or bathing? N N  Doing errands, shopping? N -  Preparing Food and eating ? N -  Using the Toilet? N -  In the past six months, have you accidently leaked urine? Y -  Comment related to recent prostate surgery -  Do you have problems  with loss of bowel control? N -  Managing your Medications? N -  Managing your Finances? N -  Housekeeping or managing your Housekeeping? N -  Some recent data might be hidden    Patient Care Team: Glean Hess, MD as PCP - General (Internal Medicine) Randa Ngo, NP as Nurse Practitioner Gloriann Loan, MD as Attending Physician (Hematology and Oncology)  Indicate any recent Medical Services you may have received from other than [facility] providers in the past year (date may be approximate).     Assessment:   This is a routine wellness examination for Xaden.  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 0 0 0 0 No  Number falls in past yr: 0 0 - 0 -  Injury with Fall? 0 0 - 0 -  Risk for fall due to : No Fall Risks - - - -  Follow up Falls prevention discussed Falls evaluation completed Falls evaluation completed Falls evaluation completed -    FALL RISK PREVENTION PERTAINING TO THE HOME:  Any stairs in or around the home? Yes  If so, are there any without handrails? No  Home free of loose throw rugs in walkways, pet beds, electrical cords, etc? Yes  Adequate lighting in your home to reduce risk of falls? Yes   ASSISTIVE DEVICES UTILIZED TO PREVENT  FALLS:  Life alert? No  Use of a cane, walker or w/c? No  Grab bars in the bathroom? No  Shower chair or bench in shower? No  Elevated toilet seat or a handicapped toilet? No   TIMED UP AND GO:  Was the test performed? No . Telephonic visit.   Cognitive Function: Normal cognitive status assessed by direct observation by this Nurse Health Advisor. No abnormalities found.          Immunizations Immunization History  Administered Date(s) Administered   Fluad Quad(high Dose 65+) 01/20/2019   Influenza-Unspecified 02/06/2020, 04/26/2020   PFIZER(Purple Top)SARS-COV-2 Vaccination 05/05/2019, 05/26/2019, 01/20/2020   Pneumococcal Conjugate-13 04/09/2017   Pneumococcal Polysaccharide-23 09/25/2018    TDAP status: Up to date per patient completed approx 3 years ago; requested record if available  Flu Vaccine status: Up to date  Pneumococcal vaccine status: Up to date  Covid-19 vaccine status: Completed vaccines  Qualifies for Shingles Vaccine? No  - advised not to get during chemo per oncology Zostavax completed No   Shingrix completed: No  Screening Tests Health Maintenance  Topic Date Due   TETANUS/TDAP  Never done   COVID-19 Vaccine (4 - Booster for Pfizer series) 04/20/2020   INFLUENZA VACCINE  11/22/2020   Zoster Vaccines- Shingrix (1 of 2) 04/24/2021 (Originally 06/12/1959)   Hepatitis C Screening  Completed   PNA vac Low Risk Adult  Completed   HPV VACCINES  Aged Out    Health Maintenance  Health Maintenance Due  Topic Date Due   TETANUS/TDAP  Never done   COVID-19 Vaccine (4 - Booster for Pfizer series) 04/20/2020   INFLUENZA VACCINE  11/22/2020    Colorectal cancer screening: No longer required.   Lung Cancer Screening: (Low Dose CT Chest recommended if Age 21-80 years, 30 pack-year currently smoking OR have quit w/in 15years.) does not qualify.     Additional Screening:  Hepatitis C Screening: does qualify; Completed 04/09/17  Vision Screening:  Recommended annual ophthalmology exams for early detection of glaucoma and other disorders of the eye. Is the patient up to date with their annual eye exam?  Yes  Who is the provider or what is the name of the office in which the patient attends annual eye exams? [facility].   Dental Screening: Recommended annual dental exams for proper oral hygiene  Community Resource Referral / Chronic  Care Management: CRR required this visit?  No   CCM required this visit?  No      Plan:     I have personally reviewed and noted the following in the patient's chart:   Medical and social history Use of alcohol, tobacco or illicit drugs  Current medications and supplements including opioid prescriptions. Patient is not currently taking opioid prescriptions. Functional ability and status Nutritional status Physical activity Advanced directives List of other physicians Hospitalizations, surgeries, and ER visits in previous 12 months Vitals Screenings to include cognitive, depression, and falls Referrals and appointments  In addition, I have reviewed and discussed with patient certain preventive protocols, quality metrics, and best practice recommendations. A written personalized care plan for preventive services as well as general preventive health recommendations were provided to patient.     Seth Marker, LPN   [id number]   Nurse Notes: please see patient's MyChart message sent today regarding dermatology referral. He is requesting to go to [facility]. Per CMA pt to contact our office for appt with Dr. Army Melia to evaluate prior to referral.

## 2020-12-20 NOTE — Patient Instructions (Signed)
Seth Woodard , Thank you for taking time to come for your Medicare Wellness Visit. I appreciate your ongoing commitment to your health goals. Please review the following plan we discussed and let me know if I can assist you in the future.   Screening recommendations/referrals: Colonoscopy: no longer required Recommended yearly ophthalmology/optometry visit for glaucoma screening and checkup Recommended yearly dental visit for hygiene and checkup  Vaccinations: Influenza vaccine: done 04/26/20 Pneumococcal vaccine: done 09/25/18 Tdap vaccine: please bring a record of this vaccine if available Shingles vaccine: n/a per oncology at this time   Covid-19: done 05/05/19, 05/26/19 & 01/20/20  Advanced directives: Please bring a copy of your health care power of attorney and living will to the office at your convenience once you have completed those documents.   Conditions/risks identified: Recommend maintaining health and activity level  Next appointment: Follow up in one year for your annual wellness visit.   Preventive Care 15 Years and Older, Male Preventive care refers to lifestyle choices and visits with your health care provider that can promote health and wellness. What does preventive care include? A yearly physical exam. This is also called an annual well check. Dental exams once or twice a year. Routine eye exams. Ask your health care provider how often you should have your eyes checked. Personal lifestyle choices, including: Daily care of your teeth and gums. Regular physical activity. Eating a healthy diet. Avoiding tobacco and drug use. Limiting alcohol use. Practicing safe sex. Taking low doses of aspirin every day. Taking vitamin and mineral supplements as recommended by your health care provider. What happens during an annual well check? The services and screenings done by your health care provider during your annual well check will depend on your age, overall health, lifestyle  risk factors, and family history of disease. Counseling  Your health care provider may ask you questions about your: Alcohol use. Tobacco use. Drug use. Emotional well-being. Home and relationship well-being. Sexual activity. Eating habits. History of falls. Memory and ability to understand (cognition). Work and work Statistician. Screening  You may have the following tests or measurements: Height, weight, and BMI. Blood pressure. Lipid and cholesterol levels. These may be checked every 5 years, or more frequently if you are over 58 years old. Skin check. Lung cancer screening. You may have this screening every year starting at age 42 if you have a 30-pack-year history of smoking and currently smoke or have quit within the past 15 years. Fecal occult blood test (FOBT) of the stool. You may have this test every year starting at age 83. Flexible sigmoidoscopy or colonoscopy. You may have a sigmoidoscopy every 5 years or a colonoscopy every 10 years starting at age 62. Prostate cancer screening. Recommendations will vary depending on your family history and other risks. Hepatitis C blood test. Hepatitis B blood test. Sexually transmitted disease (STD) testing. Diabetes screening. This is done by checking your blood sugar (glucose) after you have not eaten for a while (fasting). You may have this done every 1-3 years. Abdominal aortic aneurysm (AAA) screening. You may need this if you are a current or former smoker. Osteoporosis. You may be screened starting at age 35 if you are at high risk. Talk with your health care provider about your test results, treatment options, and if necessary, the need for more tests. Vaccines  Your health care provider may recommend certain vaccines, such as: Influenza vaccine. This is recommended every year. Tetanus, diphtheria, and acellular pertussis (Tdap, Td) vaccine. You  may need a Td booster every 10 years. Zoster vaccine. You may need this after age  30. Pneumococcal 13-valent conjugate (PCV13) vaccine. One dose is recommended after age 85. Pneumococcal polysaccharide (PPSV23) vaccine. One dose is recommended after age 79. Talk to your health care provider about which screenings and vaccines you need and how often you need them. This information is not intended to replace advice given to you by your health care provider. Make sure you discuss any questions you have with your health care provider. Document Released: 05/07/2015 Document Revised: 12/29/2015 Document Reviewed: 02/09/2015 Elsevier Interactive Patient Education  2017 [place] Prevention in the Home Falls can cause injuries. They can happen to people of all ages. There are many things you can do to make your home safe and to help prevent falls. What can I do on the outside of my home? Regularly fix the edges of walkways and driveways and fix any cracks. Remove anything that might make you trip as you walk through a door, such as a raised step or threshold. Trim any bushes or trees on the path to your home. Use bright outdoor lighting. Clear any walking paths of anything that might make someone trip, such as rocks or tools. Regularly check to see if handrails are loose or broken. Make sure that both sides of any steps have handrails. Any raised decks and porches should have guardrails on the edges. Have any leaves, snow, or ice cleared regularly. Use sand or salt on walking paths during winter. Clean up any spills in your garage right away. This includes oil or grease spills. What can I do in the bathroom? Use night lights. Install grab bars by the toilet and in the tub and shower. Do not use towel bars as grab bars. Use non-skid mats or decals in the tub or shower. If you need to sit down in the shower, use a plastic, non-slip stool. Keep the floor dry. Clean up any water that spills on the floor as soon as it happens. Remove soap buildup in the tub or shower  regularly. Attach bath mats securely with double-sided non-slip rug tape. Do not have throw rugs and other things on the floor that can make you trip. What can I do in the bedroom? Use night lights. Make sure that you have a light by your bed that is easy to reach. Do not use any sheets or blankets that are too big for your bed. They should not hang down onto the floor. Have a firm chair that has side arms. You can use this for support while you get dressed. Do not have throw rugs and other things on the floor that can make you trip. What can I do in the kitchen? Clean up any spills right away. Avoid walking on wet floors. Keep items that you use a lot in easy-to-reach places. If you need to reach something above you, use a strong step stool that has a grab bar. Keep electrical cords out of the way. Do not use floor polish or wax that makes floors slippery. If you must use wax, use non-skid floor wax. Do not have throw rugs and other things on the floor that can make you trip. What can I do with my stairs? Do not leave any items on the stairs. Make sure that there are handrails on both sides of the stairs and use them. Fix handrails that are broken or loose. Make sure that handrails are as long as the  stairways. Check any carpeting to make sure that it is firmly attached to the stairs. Fix any carpet that is loose or worn. Avoid having throw rugs at the top or bottom of the stairs. If you do have throw rugs, attach them to the floor with carpet tape. Make sure that you have a light switch at the top of the stairs and the bottom of the stairs. If you do not have them, ask someone to add them for you. What else can I do to help prevent falls? Wear shoes that: Do not have high heels. Have rubber bottoms. Are comfortable and fit you well. Are closed at the toe. Do not wear sandals. If you use a stepladder: Make sure that it is fully opened. Do not climb a closed stepladder. Make sure that  both sides of the stepladder are locked into place. Ask someone to hold it for you, if possible. Clearly mark and make sure that you can see: Any grab bars or handrails. First and last steps. Where the edge of each step is. Use tools that help you move around (mobility aids) if they are needed. These include: Canes. Walkers. Scooters. Crutches. Turn on the lights when you go into a dark area. Replace any light bulbs as soon as they burn out. Set up your furniture so you have a clear path. Avoid moving your furniture around. If any of your floors are uneven, fix them. If there are any pets around you, be aware of where they are. Review your medicines with your doctor. Some medicines can make you feel dizzy. This can increase your chance of falling. Ask your doctor what other things that you can do to help prevent falls. This information is not intended to replace advice given to you by your health care provider. Make sure you discuss any questions you have with your health care provider. Document Released: 02/04/2009 Document Revised: 09/16/2015 Document Reviewed: 05/15/2014 Elsevier Interactive Patient Education  2017 [employer].

## 2020-12-22 NOTE — Telephone Encounter (Signed)
complete

## 2021-04-24 DEATH — deceased

## 2021-12-20 NOTE — Progress Notes (Unsigned)
Subjective:   Seth Woodard is a 81 y.o. male who presents for Medicare Annual/Subsequent preventive examination.  I connected with  Seth Woodard on 12/20/21 by a audio enabled telemedicine application and verified that I am speaking with the correct person using two identifiers.  Patient Location: Home  Provider Location: Office/Clinic  I discussed the limitations of evaluation and management by telemedicine. The patient expressed understanding and agreed to proceed.   Review of Systems    Defer to PCP       Objective:    There were no vitals filed for this visit. There is no height or weight on file to calculate BMI.     12/20/2020   10:15 AM 08/02/2020    9:47 AM 07/29/2020    2:41 PM 04/29/2019    4:53 PM 06/18/2017   11:00 AM  Advanced Directives  Does Patient Have a Medical Advance Directive? Yes  Yes No Yes  Type of Paramedic of [place];Living will Healthcare Power of [place];Living will  [place];Living will  Copy of [place] in Chart? No - copy requested    No - copy requested    Current Medications (verified) Outpatient Encounter Medications as of 12/21/2021  Medication Sig   bisoprolol (ZEBETA) 5 MG tablet Take 2.5 mg by mouth.   valACYclovir (VALTREX) 500 MG tablet Take 500 mg by mouth daily.   XOSPATA 40 MG TABS Take 3 tablets by mouth in the morning.   [DISCONTINUED] bisoprolol-hydrochlorothiazide (ZIAC) 2.5-6.25 MG tablet Take 1 tablet by mouth daily.   No facility-administered encounter medications on file as of 12/21/2021.    Allergies (verified) Patient has no known allergies.   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 Social History   Socioeconomic History   Marital status: Married    Spouse name: Seth Woodard   Number of children: Not on file   Years of education: Not on file   Highest education level: Not on file  Occupational History   Not on file  Tobacco Use   Smoking status: Never   Smokeless tobacco: Never  Vaping Use   Vaping Use: Never used  Substance and Sexual Activity   Alcohol use: No   Drug use: No   Sexual activity: Not Currently  Other Topics Concern   Not on file  Social History Narrative   Not on file   Social Determinants of Health   Financial Resource Strain: Low Risk  (12/20/2020)   Overall Financial Resource Strain (CARDIA)    Difficulty of Paying Living Expenses: Not hard at all  Food Insecurity: No Food Insecurity (12/20/2020)   Hunger Vital Sign    Worried About Running Out of Food in the Last Year: Never true    [place] in  the Last Year: Never true  Transportation Needs: No Transportation Needs (12/20/2020)   PRAPARE - Hydrologist (Medical): No    Lack of Transportation (Non-Medical): No  Physical Activity: Inactive (12/20/2020)   Exercise Vital Sign    Days of Exercise per Week: 0 days    Minutes of Exercise per Session: 0 min  Stress: No Stress Concern Present (12/20/2020)   [place]    Feeling of Stress : Not at all  Social Connections: Moderately Isolated (12/20/2020)   Social Connection and Isolation Panel [NHANES]    Frequency of Communication with Friends and Family: More  than three times a week    Frequency of Social Gatherings with Friends and Family: More than three times a week    Attends Religious Services: Never    Marine scientist or Organizations: No    Attends Archivist Meetings: Never    Marital Status: Married    Tobacco Counseling Counseling given: Not Answered   Clinical Intake:                 Diabetic? No.         Activities of Daily Living     No data to display           Patient Care Team: Glean Hess, MD as PCP - General (Internal Medicine) Randa Ngo, NP as Nurse Practitioner Gloriann Loan, MD as Attending Physician (Hematology and Oncology)  Indicate any recent Medical Services you may have received from other than [facility] providers in the past year (date may be approximate).     Assessment:   This is a routine wellness examination for Seth Woodard.  Hearing/Vision screen No results found.  Dietary issues and exercise activities discussed:     Goals Addressed   None   Depression Screen    12/20/2020   10:14 AM 07/19/2020    2:39 PM 05/26/2020    3:30 PM 09/25/2018    1:41 PM 04/09/2017   11:03 AM  PHQ 2/9 Scores  PHQ - 2 Score 0 0 0 0 0  PHQ- 9 Score  0 0      Fall Risk    12/20/2020   10:16 AM 07/19/2020    2:39 PM 05/26/2020    3:30 PM 09/25/2018    1:41 PM 04/09/2017   11:04 AM  [place] in the past year? 0 0 0 0 No  Number falls in past yr: 0 0  0   Injury with Fall? 0 0  0   Risk for fall due to : No Fall Risks      Follow up Falls prevention discussed Falls evaluation completed Falls evaluation completed Falls evaluation completed     [place]:  Any stairs in or around the home? {YES/NO:21197} If so, are there any without handrails? {YES/NO:21197} Home free of loose throw rugs in walkways, pet beds, electrical cords, etc? {YES/NO:21197} Adequate lighting in your home to reduce risk of falls?  {YES/NO:21197}  ASSISTIVE DEVICES UTILIZED TO PREVENT FALLS:  Life alert? {YES/NO:21197} Use of a cane, walker or w/c? {YES/NO:21197} Grab bars in the bathroom? {YES/NO:21197} Shower chair or bench in shower? {YES/NO:21197} Elevated toilet seat or a handicapped toilet? {YES/NO:21197}  TIMED UP AND GO:  Was the test performed? {YES/NO:21197}.  Length of time to ambulate 10 feet: *** sec.   {Appearance of TMLY:6503546}  Cognitive Function:        Immunizations Immunization History  Administered Date(s) Administered   Fluad Quad(high Dose 65+) 01/20/2019   Influenza-Unspecified 02/06/2020, 04/26/2020   PFIZER(Purple Top)SARS-COV-2 Vaccination 05/05/2019, 05/26/2019, 01/20/2020   Pneumococcal Conjugate-13 04/09/2017   Pneumococcal Polysaccharide-23 09/25/2018    TDAP status: Due, Education has been provided regarding the importance of this vaccine. Advised may receive this vaccine at local pharmacy or Health Dept. Aware to provide a copy of the vaccination record if obtained from local pharmacy or Health Dept. Verbalized acceptance and understanding.  Flu Vaccine status: Due, Education has been provided regarding the importance of this vaccine. Advised may receive this vaccine at local pharmacy or Health Dept. Aware to provide a copy of the vaccination record if obtained from local pharmacy or Health Dept. Verbalized acceptance and understanding.  Pneumococcal vaccine status: Up to date  Covid-19 vaccine status: Completed vaccines  Qualifies for Shingles Vaccine? No   Zostavax completed No   Shingrix Completed?: No.    Education has been provided regarding the importance of this vaccine. Patient has been advised to call insurance company to determine out of pocket expense if they have not yet received this vaccine. Advised may also receive vaccine at local pharmacy or Health Dept. Verbalized acceptance and understanding.  Screening Tests Health Maintenance  Topic Date Due    TETANUS/TDAP  Never done   Zoster Vaccines- Shingrix (1 of 2) Never done   COVID-19 Vaccine (4 - Pfizer risk series) 03/16/2020   INFLUENZA VACCINE  11/22/2021   Pneumonia Vaccine 20+ Years old  Completed   HPV VACCINES  Aged Out    Health Maintenance  Health Maintenance Due  Topic Date Due   TETANUS/TDAP  Never done   Zoster Vaccines- Shingrix (1 of 2) Never done   COVID-19 Vaccine (4 - Pfizer risk series) 03/16/2020   INFLUENZA VACCINE  11/22/2021    Colorectal cancer screening: No longer required.   Lung Cancer Screening: (Low Dose CT Chest recommended if Age 65-80 years, 30 pack-year currently smoking OR have quit w/in 15years.) does not qualify.   Lung Cancer Screening Referral: N/A  Additional Screening:  Hepatitis C Screening: does not qualify.  Vision Screening: Recommended annual ophthalmology exams for early detection of glaucoma and other disorders of the eye. Is the patient up to date with their annual eye exam?  {YES/NO:21197} Who is the provider or what is the name of the office in which the patient attends annual eye exams? *** If pt is not established with a provider, would they like to be referred to a provider to establish care? {YES/NO:21197}.   Dental Screening: Recommended annual dental exams for proper oral hygiene  Community Resource Referral / Chronic Care Management: CRR required this visit?  {YES/NO:21197}  CCM required this visit?  {YES/NO:21197}     Plan:     I have personally reviewed and noted the following in the patient's chart:   Medical and social history Use of alcohol, tobacco or illicit drugs  Current medications and supplements including opioid prescriptions. Patient is not currently taking opioid prescriptions. Functional ability and status Nutritional status Physical activity Advanced directives List of other physicians Hospitalizations, surgeries, and ER visits in previous 12 months Vitals Screenings to include  cognitive, depression, and falls Referrals and appointments  In addition, I have reviewed and discussed with patient certain preventive protocols, quality metrics, and best practice recommendations. A written personalized care plan for preventive services as well as general preventive health recommendations were  provided to patient.     Clista Bernhardt, [place]   12/20/2021   Nurse Notes: ***   Mr. Choinski , Thank you for taking time to come for your Medicare Wellness Visit. I appreciate your ongoing commitment to your health goals. Please review the following plan we discussed and let me know if I can assist you in the future.   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

## 2021-12-21 ENCOUNTER — Ambulatory Visit: Payer: Medicare Other

## 2021-12-21 DIAGNOSIS — Z0000 Encounter for general adult medical examination without abnormal findings: Secondary | ICD-10-CM

## 2022-01-13 IMAGING — CT CT BIOPSY AND ASPIRATION BONE MARROW
2 of 3 series · 8 of 14 positions shown, 10 images · non-contrast
Comparison: none

INDICATION: History of monoclonal gammopathy. Please perform CT-guided bone
marrow biopsy for tissue diagnostic purposes.

[Series 2: i-spiral 5.0 b30f · axial · 0.52mm/px · z∈[-262,-192]mm · 6 of 28 slices shown, 8 images (1 of 2)]
[im 4/28  soft-tissue]
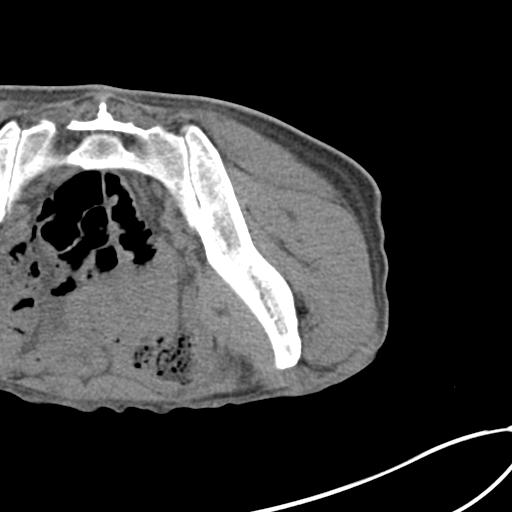
[im 4/28  bone]
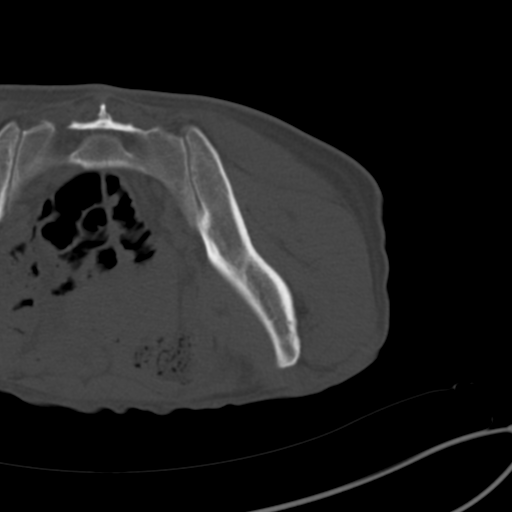
[im 8/28  bone]
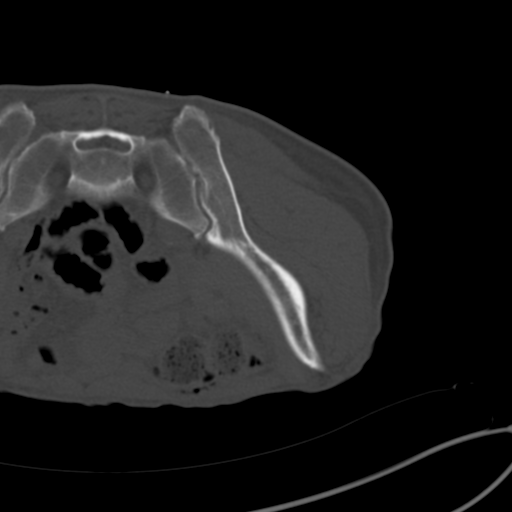
[im 12/28  bone]
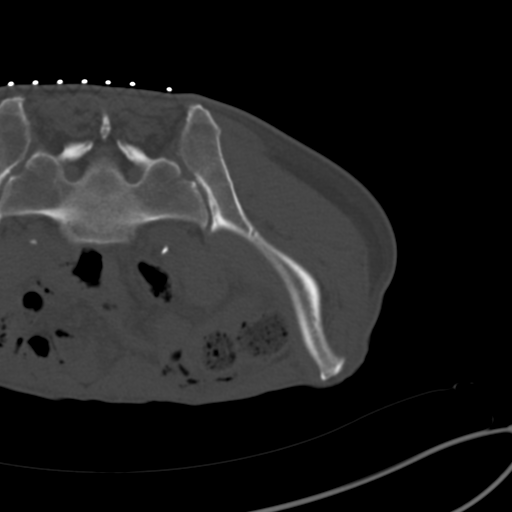
[im 16/28  bone]
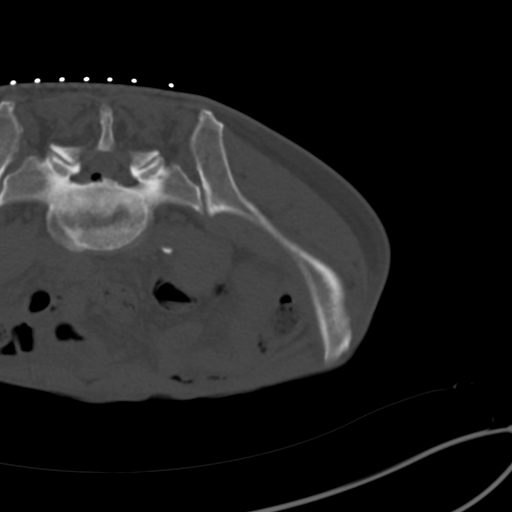
[im 20/28  soft-tissue]
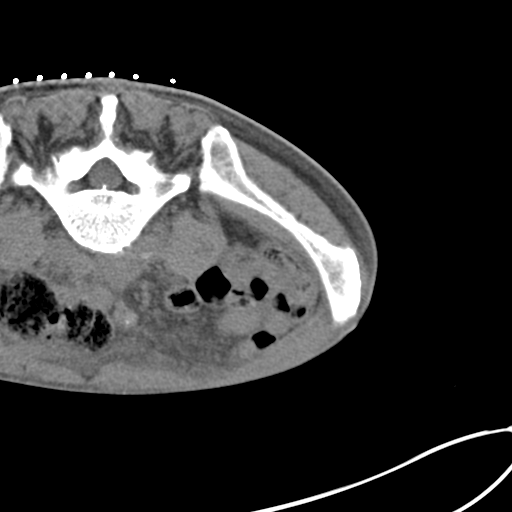
[im 20/28  bone]
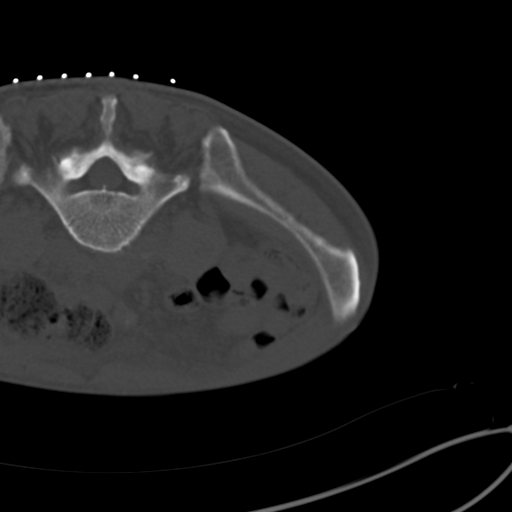
[im 24/28  bone]
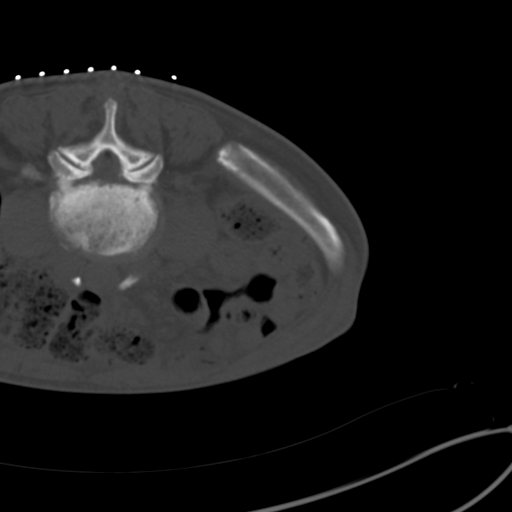

[Series 3: i-spiral 5.0 b30f · axial · 0.52mm/px · z∈[-243,-226]mm · 2 of 15 slices shown (2 of 2)]
[im 5/15  bone]
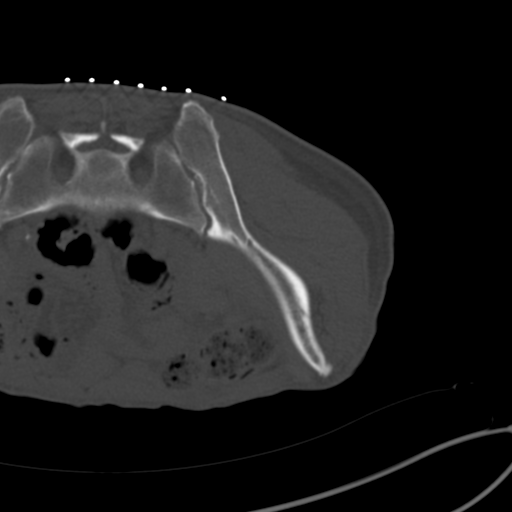
[im 10/15  bone]
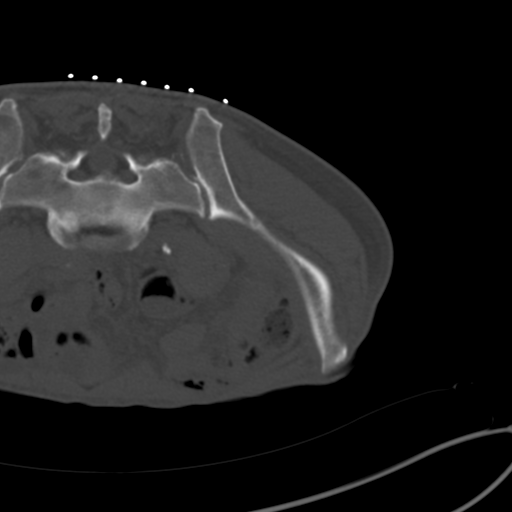

[8 of 14 positions shown; findings below may reference images not displayed]

EXAM:
CT-GUIDED BONE MARROW BIOPSY AND ASPIRATION

MEDICATIONS:
None

ANESTHESIA/SEDATION:
Fentanyl 100 mcg IV; Versed 2 mg IV

Sedation Time: 10 Minutes; The patient was continuously monitored
during the procedure by the interventional radiology nurse under my
direct supervision.

COMPLICATIONS:
None immediate.

PROCEDURE:
Informed consent was obtained from the patient following an
explanation of the procedure, risks, benefits and alternatives. The
patient understands, agrees and consents for the procedure. All
questions were addressed. A time out was performed prior to the
initiation of the procedure.

The patient was positioned prone and non-contrast localization CT
was performed of the pelvis to demonstrate the iliac marrow spaces.
The operative site was prepped and draped in the usual sterile
fashion.

Under sterile conditions and local anesthesia, a 22 gauge spinal
needle was utilized for procedural planning. Next, an 11 gauge
coaxial bone biopsy needle was advanced into the left iliac marrow
space. Needle position was confirmed with CT imaging. Initially, a
bone marrow aspiration was performed. Next, a bone marrow biopsy was
obtained with the 11 gauge outer bone marrow device. The needle was
removed and superficial hemostasis was obtained with manual
compression. A dressing was applied. The patient tolerated the
procedure well without immediate post procedural complication.
IMPRESSION: Successful CT guided left iliac bone marrow aspiration and core
biopsy.

## 2022-02-16 ENCOUNTER — Telehealth: Payer: Self-pay | Admitting: Internal Medicine

## 2022-02-16 NOTE — Telephone Encounter (Signed)
Copied from [place] [id number]. Topic: Medicare AWV >> Feb 16, 2022 10:45 AM Jae Dire wrote: Reason for CRM:  No answer unable to leave a message for patient to call back and schedule Medicare Annual Wellness Visit (AWV) in office.   If unable to come into the office for AWV,  please offer to do virtually or by telephone.  Last AWV: 12/20/2020  Please schedule at any time with [facility].      30 minute appointment for Virtual or phone 45 minute appointment for in office or Initial virtual/phone  Any questions, please call me at [phone]
